# Patient Record
Sex: Female | Born: 2000 | Race: White | Hispanic: No | Marital: Single | Smoking: Never smoker
Health system: Southern US, Community
[De-identification: ages and names within clinical notes are randomized; demographics above are authoritative.]

## PROBLEM LIST (undated history)

## (undated) DIAGNOSIS — R011 Cardiac murmur, unspecified: Secondary | ICD-10-CM

## (undated) DIAGNOSIS — IMO0002 Reserved for concepts with insufficient information to code with codable children: Secondary | ICD-10-CM

## (undated) DIAGNOSIS — J45909 Unspecified asthma, uncomplicated: Secondary | ICD-10-CM

## (undated) DIAGNOSIS — M3500 Sicca syndrome, unspecified: Secondary | ICD-10-CM

## (undated) DIAGNOSIS — T7840XA Allergy, unspecified, initial encounter: Secondary | ICD-10-CM

## (undated) HISTORY — DX: Cardiac murmur, unspecified: R01.1

## (undated) HISTORY — PX: WISDOM TOOTH EXTRACTION: SHX21

## (undated) HISTORY — DX: Sjogren syndrome, unspecified: M35.00

## (undated) HISTORY — DX: Allergy, unspecified, initial encounter: T78.40XA

---
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
## 2020-01-04 ENCOUNTER — Ambulatory Visit: Payer: 59 | Attending: Internal Medicine

## 2020-01-04 DIAGNOSIS — Z23 Encounter for immunization: Secondary | ICD-10-CM

## 2020-01-04 NOTE — Progress Notes (Signed)
   Covid-19 Vaccination Clinic  Name:  Kendra Morton    MRN: [id number] DOB: 2001-07-12  01/04/2020  Kendra Morton was observed post Covid-19 immunization for 15 minutes without incident. She was provided with Vaccine Information Sheet and instruction to access the V-Safe system.   Kendra Morton was instructed to call 911 with any severe reactions post vaccine: Marland Kitchen Difficulty breathing  . Swelling of face and throat  . A fast heartbeat  . A bad rash all over body  . Dizziness and weakness   Immunizations Administered    Name Date Dose VIS Date Route   Pfizer COVID-19 Vaccine 01/04/2020 10:50 AM 0.3 mL 10/26/2018 Intramuscular   Manufacturer: [employer], [employer]   Lot: [id number]   NDC: [address]-9

## 2020-02-20 DIAGNOSIS — M79671 Pain in right foot: Secondary | ICD-10-CM | POA: Diagnosis not present

## 2020-02-20 DIAGNOSIS — M21622 Bunionette of left foot: Secondary | ICD-10-CM | POA: Diagnosis not present

## 2020-02-20 DIAGNOSIS — M21621 Bunionette of right foot: Secondary | ICD-10-CM | POA: Diagnosis not present

## 2020-02-20 DIAGNOSIS — M79672 Pain in left foot: Secondary | ICD-10-CM | POA: Diagnosis not present

## 2020-03-20 ENCOUNTER — Other Ambulatory Visit: Payer: Self-pay | Admitting: Podiatry

## 2020-03-20 DIAGNOSIS — M21622 Bunionette of left foot: Secondary | ICD-10-CM | POA: Diagnosis not present

## 2020-03-21 ENCOUNTER — Encounter
Admission: RE | Admit: 2020-03-21 | Discharge: 2020-03-21 | Disposition: A | Discharge status: Home / Self Care | Payer: 59 | Source: Ambulatory Visit | Attending: Podiatry | Admitting: Podiatry

## 2020-03-21 ENCOUNTER — Other Ambulatory Visit: Payer: Self-pay

## 2020-03-21 NOTE — Patient Instructions (Signed)
Your procedure is scheduled on: Friday March 30, 2020. Report to Day Surgery inside [facility] 2nd floor. To find out your arrival time please call [phone] between 1PM - 3PM on Thursday March 29, 2020.  Remember: Instructions that are not followed completely may result in serious medical risk,  up to and including death, or upon the discretion of your surgeon and anesthesiologist your  surgery may need to be rescheduled.     _X__ 1. Do not eat food after midnight the night before your procedure.                 No gum chewing or hard candies. You may drink clear liquids up to 2 hours                 before you are scheduled to arrive for your surgery- DO not drink clear                 liquids within 2 hours of the start of your surgery.                 Clear Liquids include:  water, apple juice without pulp, clear Gatorade, G2 or                  Gatorade Zero (avoid Red/Purple/Blue), Black Coffee or Tea (Do not add                 anything to coffee or tea).  __X__2. Complete the carbohydrate drink provided to you, 2 hours before arrival.  __X__3.  On the morning of surgery brush your teeth with toothpaste and water, you                may rinse your mouth with mouthwash if you wish.  Do not swallow any toothpaste of mouthwash.     _X__ 4.  No Alcohol for 24 hours before or after surgery.   _X__ 5.  Do Not Smoke or use e-cigarettes For 24 Hours Prior to Your Surgery.                 Do not use any chewable tobacco products for at least 6 hours prior to                 Surgery.  _X__  6.  Do not use any recreational drugs (marijuana, cocaine, heroin, ecstacy, MDMA or other)                For at least one week prior to your surgery.  Combination of these drugs with anesthesia                May have life threatening results.  __X__  7.  Notify your doctor if there is any change in your medical condition      (cold, fever, infections).     Do not wear  jewelry, make-up, hairpins, clips or nail polish. Do not wear lotions, powders, or perfumes. You may wear deodorant. Do not shave 48 hours prior to surgery. Men may shave face and neck. Do not bring valuables to the hospital.    [facility] is not responsible for any belongings or valuables.  Contacts, dentures or bridgework may not be worn into surgery. Leave your suitcase in the car. After surgery it may be brought to your room. For patients admitted to the hospital, discharge time is determined by your treatment team.   Patients discharged the day of surgery will  not be allowed to drive home.   Make arrangements for someone to be with you for the first 24 hours of your Same Day Discharge.   ____ Take these medicines the morning of surgery with A SIP OF WATER:    1. None  __X__ Use CHG Soap as directed  __X__ Use inhalers on the day of surgery  albuterol (VENTOLIN HFA) 108 (90 Base) MCG/ACT inhaler  fluticasone (FLOVENT HFA) 110 MCG/ACT inhaler  __X__ Stop Anti-inflammatories such as Ibuprofen, Aleve, Advil, naproxen, aspirin and or BC powders.    ____ Stop supplements until after surgery.    __X__ Do not start any herbal supplements before your surgery.

## 2020-03-27 ENCOUNTER — Other Ambulatory Visit
Admission: RE | Admit: 2020-03-27 | Discharge: 2020-03-27 | Disposition: A | Discharge status: Home / Self Care | Payer: 59 | Source: Ambulatory Visit | Attending: Podiatry | Admitting: Podiatry

## 2020-03-27 ENCOUNTER — Other Ambulatory Visit: Payer: Self-pay

## 2020-03-27 DIAGNOSIS — Z20822 Contact with and (suspected) exposure to covid-19: Secondary | ICD-10-CM | POA: Insufficient documentation

## 2020-03-27 DIAGNOSIS — Z01812 Encounter for preprocedural laboratory examination: Secondary | ICD-10-CM | POA: Diagnosis not present

## 2020-03-27 LAB — SARS CORONAVIRUS 2 (TAT 6-24 HRS): SARS Coronavirus 2: NEGATIVE

## 2020-03-28 ENCOUNTER — Other Ambulatory Visit: Payer: 59

## 2020-03-30 ENCOUNTER — Ambulatory Visit: Payer: 59

## 2020-03-30 ENCOUNTER — Encounter
Admission: RE | Disposition: A | Discharge status: Home / Self Care | Payer: Self-pay | Source: Ambulatory Visit | Attending: Podiatry

## 2020-03-30 ENCOUNTER — Ambulatory Visit: Payer: 59 | Admitting: Anesthesiology

## 2020-03-30 ENCOUNTER — Ambulatory Visit
Admission: RE | Admit: 2020-03-30 | Discharge: 2020-03-30 | Disposition: A | Discharge status: Home / Self Care | Payer: 59 | Source: Ambulatory Visit | Attending: Podiatry | Admitting: Podiatry

## 2020-03-30 ENCOUNTER — Encounter: Payer: Self-pay | Admitting: Podiatry

## 2020-03-30 DIAGNOSIS — J45909 Unspecified asthma, uncomplicated: Secondary | ICD-10-CM | POA: Insufficient documentation

## 2020-03-30 DIAGNOSIS — M21612 Bunion of left foot: Secondary | ICD-10-CM | POA: Insufficient documentation

## 2020-03-30 DIAGNOSIS — M216X2 Other acquired deformities of left foot: Secondary | ICD-10-CM | POA: Diagnosis not present

## 2020-03-30 DIAGNOSIS — Z79899 Other long term (current) drug therapy: Secondary | ICD-10-CM | POA: Insufficient documentation

## 2020-03-30 DIAGNOSIS — M21622 Bunionette of left foot: Secondary | ICD-10-CM | POA: Diagnosis not present

## 2020-03-30 HISTORY — PX: WEIL OSTEOTOMY: SHX5044

## 2020-03-30 LAB — POCT PREGNANCY, URINE: Preg Test, Ur: NEGATIVE

## 2020-03-30 SURGERY — OSTEOTOMY, WEIL
Anesthesia: General | Site: Foot | Laterality: Left

## 2020-03-30 MED ORDER — BUPIVACAINE LIPOSOME 1.3 % IJ SUSP
INTRAMUSCULAR | Status: AC
  Filled 2020-03-30: qty 20

## 2020-03-30 MED ORDER — PROPOFOL 500 MG/50ML IV EMUL
INTRAVENOUS | Status: AC
  Filled 2020-03-30: qty 50

## 2020-03-30 MED ORDER — POVIDONE-IODINE 7.5 % EX SOLN
Freq: Once | CUTANEOUS | Status: DC
  Filled 2020-03-30: qty 118

## 2020-03-30 MED ORDER — FENTANYL CITRATE (PF) 100 MCG/2ML IJ SOLN: 25.0000 ug | INTRAMUSCULAR | Status: DC | PRN

## 2020-03-30 MED ORDER — ONDANSETRON HCL 4 MG/2ML IJ SOLN
INTRAMUSCULAR | Status: DC | PRN
  Administered 2020-03-30: 4 mg via INTRAVENOUS

## 2020-03-30 MED ORDER — MIDAZOLAM HCL 2 MG/2ML IJ SOLN
INTRAMUSCULAR | Status: DC | PRN
  Administered 2020-03-30: 2 mg via INTRAVENOUS

## 2020-03-30 MED ORDER — LIDOCAINE HCL (PF) 2 % IJ SOLN
INTRAMUSCULAR | Status: AC
  Filled 2020-03-30: qty 5

## 2020-03-30 MED ORDER — MIDAZOLAM HCL 2 MG/2ML IJ SOLN
INTRAMUSCULAR | Status: AC
  Filled 2020-03-30: qty 2

## 2020-03-30 MED ORDER — BUPIVACAINE-EPINEPHRINE (PF) 0.5% -1:200000 IJ SOLN
INTRAMUSCULAR | Status: AC
  Filled 2020-03-30: qty 30

## 2020-03-30 MED ORDER — ONDANSETRON HCL 4 MG/2ML IJ SOLN: 4.0000 mg | Freq: Four times a day (QID) | INTRAMUSCULAR | Status: DC | PRN

## 2020-03-30 MED ORDER — PROPOFOL 500 MG/50ML IV EMUL
INTRAVENOUS | Status: DC | PRN
  Administered 2020-03-30: 150 ug/kg/min via INTRAVENOUS

## 2020-03-30 MED ORDER — FAMOTIDINE 20 MG PO TABS: 20.0000 mg | ORAL_TABLET | Freq: Once | ORAL | Status: DC

## 2020-03-30 MED ORDER — ORAL CARE MOUTH RINSE: 15.0000 mL | Freq: Once | OROMUCOSAL | Status: AC

## 2020-03-30 MED ORDER — FENTANYL CITRATE (PF) 100 MCG/2ML IJ SOLN
INTRAMUSCULAR | Status: AC
  Filled 2020-03-30: qty 2

## 2020-03-30 MED ORDER — HYDROCODONE-ACETAMINOPHEN 5-325 MG PO TABS: 1.0000 | ORAL_TABLET | Freq: Four times a day (QID) | ORAL | 0 refills | Status: DC | PRN

## 2020-03-30 MED ORDER — CHLORHEXIDINE GLUCONATE 0.12 % MT SOLN: 15.0000 mL | Freq: Once | OROMUCOSAL | Status: AC

## 2020-03-30 MED ORDER — LACTATED RINGERS IV SOLN: INTRAVENOUS | Status: DC

## 2020-03-30 MED ORDER — ONDANSETRON HCL 4 MG/2ML IJ SOLN: 4.0000 mg | Freq: Once | INTRAMUSCULAR | Status: DC | PRN

## 2020-03-30 MED ORDER — BUPIVACAINE HCL (PF) 0.5 % IJ SOLN
INTRAMUSCULAR | Status: AC
  Filled 2020-03-30: qty 30

## 2020-03-30 MED ORDER — LIDOCAINE HCL (CARDIAC) PF 100 MG/5ML IV SOSY
PREFILLED_SYRINGE | INTRAVENOUS | Status: DC | PRN
  Administered 2020-03-30: 100 mg via INTRAVENOUS

## 2020-03-30 MED ORDER — CLINDAMYCIN PHOSPHATE 900 MG/50ML IV SOLN
INTRAVENOUS | Status: AC
  Filled 2020-03-30: qty 50

## 2020-03-30 MED ORDER — FENTANYL CITRATE (PF) 100 MCG/2ML IJ SOLN
INTRAMUSCULAR | Status: DC | PRN
  Administered 2020-03-30: 50 ug via INTRAVENOUS

## 2020-03-30 MED ORDER — BUPIVACAINE HCL (PF) 0.25 % IJ SOLN
INTRAMUSCULAR | Status: DC | PRN
  Administered 2020-03-30: 5 mL

## 2020-03-30 MED ORDER — CHLORHEXIDINE GLUCONATE 0.12 % MT SOLN
OROMUCOSAL | Status: AC
  Administered 2020-03-30: 15 mL via OROMUCOSAL
  Filled 2020-03-30: qty 15

## 2020-03-30 MED ORDER — CLINDAMYCIN PHOSPHATE 900 MG/50ML IV SOLN
900.0000 mg | INTRAVENOUS | Status: AC
  Administered 2020-03-30: 900 mg via INTRAVENOUS

## 2020-03-30 MED ORDER — ONDANSETRON HCL 4 MG PO TABS: 4.0000 mg | ORAL_TABLET | Freq: Four times a day (QID) | ORAL | Status: DC | PRN

## 2020-03-30 MED ORDER — PROPOFOL 10 MG/ML IV BOLUS
INTRAVENOUS | Status: DC | PRN
  Administered 2020-03-30: 200 mg via INTRAVENOUS

## 2020-03-30 MED ORDER — BUPIVACAINE LIPOSOME 1.3 % IJ SUSP
INTRAMUSCULAR | Status: DC | PRN
  Administered 2020-03-30 (×2): 5 mL

## 2020-03-30 MED ORDER — PROPOFOL 500 MG/50ML IV EMUL
INTRAVENOUS | Status: AC
  Filled 2020-03-30: qty 100

## 2020-03-30 SURGICAL SUPPLY — 44 items
BLADE OSC/SAGITTAL 5.5X25 (BLADE) ×2 IMPLANT
BLADE OSC/SAGITTAL MD 5.5X18 (BLADE) ×2 IMPLANT
BLADE SURG 15 STRL LF DISP TIS (BLADE) ×2 IMPLANT
BLADE SURG 15 STRL SS (BLADE) ×4
BNDG CMPR 75X41 PLY HI ABS (GAUZE/BANDAGES/DRESSINGS) ×1
BNDG CMPR STD VLCR NS LF 5.8X4 (GAUZE/BANDAGES/DRESSINGS) ×2
BNDG CONFORM 3 STRL LF (GAUZE/BANDAGES/DRESSINGS) ×2 IMPLANT
BNDG ELASTIC 4X5.8 VLCR NS LF (GAUZE/BANDAGES/DRESSINGS) ×4 IMPLANT
BNDG ELASTIC 4X5.8 VLCR STR LF (GAUZE/BANDAGES/DRESSINGS) ×2 IMPLANT
BNDG ESMARK 4X12 TAN STRL LF (GAUZE/BANDAGES/DRESSINGS) ×2 IMPLANT
BNDG GAUZE 4.5X4.1 6PLY STRL (MISCELLANEOUS) ×2 IMPLANT
BNDG STRETCH 4X75 STRL LF (GAUZE/BANDAGES/DRESSINGS) ×2 IMPLANT
CANISTER SUCT 1200ML W/VALVE (MISCELLANEOUS) ×2 IMPLANT
COVER WAND RF STERILE (DRAPES) ×2 IMPLANT
CUFF TOURN SGL QUICK 12 (TOURNIQUET CUFF) IMPLANT
CUFF TOURN SGL QUICK 18X4 (TOURNIQUET CUFF) IMPLANT
DRAPE FLUOR MINI C-ARM 54X84 (DRAPES) ×2 IMPLANT
DURAPREP 26ML APPLICATOR (WOUND CARE) ×2 IMPLANT
ELECT REM PT RETURN 9FT ADLT (ELECTROSURGICAL) ×2
ELECTRODE REM PT RTRN 9FT ADLT (ELECTROSURGICAL) ×1 IMPLANT
GAUZE SPONGE 4X4 12PLY STRL (GAUZE/BANDAGES/DRESSINGS) ×2 IMPLANT
GAUZE XEROFORM 1X8 LF (GAUZE/BANDAGES/DRESSINGS) ×2 IMPLANT
GLOVE BIO SURGEON STRL SZ7.5 (GLOVE) ×2 IMPLANT
GLOVE INDICATOR 8.0 STRL GRN (GLOVE) ×2 IMPLANT
GOWN STRL REUS W/ TWL XL LVL3 (GOWN DISPOSABLE) ×2 IMPLANT
GOWN STRL REUS W/TWL XL LVL3 (GOWN DISPOSABLE) ×4
KIT TURNOVER KIT A (KITS) ×2 IMPLANT
LABEL OR SOLS (LABEL) ×2 IMPLANT
NDL SAFETY ECLIPSE 18X1.5 (NEEDLE) ×1 IMPLANT
NEEDLE HYPO 18GX1.5 SHARP (NEEDLE) ×2
NEEDLE HYPO 25X1 1.5 SAFETY (NEEDLE) ×6 IMPLANT
NS IRRIG 500ML POUR BTL (IV SOLUTION) ×2 IMPLANT
PACK EXTREMITY (MISCELLANEOUS) ×2 IMPLANT
PAD ABD DERMACEA PRESS 5X9 (GAUZE/BANDAGES/DRESSINGS) ×2 IMPLANT
PENCIL ELECTRO HAND CTR (MISCELLANEOUS) ×2 IMPLANT
STOCKINETTE IMPERVIOUS 9X36 MD (GAUZE/BANDAGES/DRESSINGS) ×2 IMPLANT
STRIP CLOSURE SKIN 1/4X4 (GAUZE/BANDAGES/DRESSINGS) ×2 IMPLANT
SUT MNCRL AB 4-0 PS2 18 (SUTURE) ×2 IMPLANT
SUT VIC AB 4-0 FS2 27 (SUTURE) ×2 IMPLANT
SUT VICRYL AB 3-0 FS1 BRD 27IN (SUTURE) ×2 IMPLANT
SYR 10ML LL (SYRINGE) ×4 IMPLANT
WIRE Z .045 C-WIRE SPADE TIP (WIRE) ×2 IMPLANT
WIRE Z .062 C-WIRE SPADE TIP (WIRE) ×4 IMPLANT
kirschiner k wire ×2 IMPLANT

## 2020-03-30 NOTE — H&P (Signed)
HISTORY AND PHYSICAL INTERVAL NOTE:  03/30/2020  7:24 AM  Kendra Morton  has presented today for surgery, with the diagnosis of TAILOR BUNION.  The various methods of treatment have been discussed with the patient.  No guarantees were given.  After consideration of risks, benefits and other options for treatment, the patient has consented to surgery.  I have reviewed the patients' chart and labs.     A history and physical examination was performed in my office.  The patient was reexamined.  There have been no changes to this history and physical examination.  Kendra Morton A

## 2020-03-30 NOTE — Discharge Instructions (Signed)
       AMBULATORY SURGERY  DISCHARGE INSTRUCTIONS   1) The drugs that you were given will stay in your system until tomorrow so for the next 24 hours you should not:  A) Drive an automobile B) Make any legal decisions C) Drink any alcoholic beverage   2) You may resume regular meals tomorrow.  Today it is better to start with liquids and gradually work up to solid foods.  You may eat anything you prefer, but it is better to start with liquids, then soup and crackers, and gradually work up to solid foods.   3) Please notify your doctor immediately if you have any unusual bleeding, trouble breathing, redness and pain at the surgery site, drainage, fever, or pain not relieved by medication.    4) Additional Instructions:        Please contact your physician with any problems or Same Day Surgery at [phone], Monday through Friday 6 am to 4 pm, or [place] [facility] number at [phone].  [facility]  POST OPERATIVE INSTRUCTIONS FOR DR. TROXLER, DR. FOWLER, AND DR. [facility]PART[facility]   1. Take your medication as prescribed.  Pain medication should be taken only as needed.  2. Keep the dressing clean, dry and intact.  3. Keep your foot elevated above the heart level for the first 48 hours.  4. Walking to the bathroom and brief periods of walking are acceptable, unless we have instructed you to be non-weight bearing.  5. Always wear your post-op shoe when walking.  Always use your crutches if you are to be non-weight bearing.  6. Do not take a shower. Baths are permissible as long as the foot is kept out of the water.   7. Every hour you are awake:  - Bend your knee 15 times. - Flex foot 15 times - Massage calf 15 times  8. Call [facility] ([phone]) if any of the following problems occur: - You develop a temperature or fever. - The bandage becomes saturated with  blood. - Medication does not stop your pain. - Injury of the foot occurs. - Any symptoms of infection including redness, odor, or red streaks running from wound.  

## 2020-03-30 NOTE — Op Note (Signed)
Operative note   Surgeon:Johnmatthew Solorio [employer] logistics/support/administrative officer: None    Preop diagnosis: Tailor's bunion left foot    Postop diagnosis: Same    Procedure: Distal fifth metatarsal Z osteotomy left foot    EBL: Minimal    Anesthesia:local and general. Local consisted of a total of 5 cc of 0.25% bupivacaine and 10 cc of Exparel long-acting anesthetic    Hemostasis: Ankle tourniquet inflated to 200 mmHg for 47 minutes    Specimen: None    Complications: None    Operative indications:Kendra Morton is an 19 y.o. that presents today for surgical intervention.  The risks/benefits/alternatives/complications have been discussed and consent has been given.    Procedure:  Patient was brought into the OR and placed on the operating table in thesupine position. After anesthesia was obtained theleft lower extremity was prepped and draped in usual sterile fashion.  Was directed to the dorsomedial left fifth MTPJ. Sharp and blunt dissection carried down to the capsule. A longitudinal capsulotomy was performed. This exposed the laterally deviated distal fifth metatarsal. Next a distal Z osteotomy was created. The capital fragment was translocated and rotated medially. Good realignment of the fifth MTPJ was noted on fluoroscopy intraoperatively. This was then stabilized with a 0.045 threaded K wire. Good alignment and stability was noted at this time. The ensuing overhanging ledge was then transected. The dorsal lateral aspect of the fifth metatarsal was recontoured. The wound was flushed with copious amounts of irrigation. A small capsulorrhaphy was performed laterally. Closure was performed with a 3-0 Vicryl for the capsular layer, 4-0 Vicryl for the subcutaneous tissue and a 4-0 Monocryl for the skin. A bulky sterile dressing was applied.    Patient tolerated the procedure and anesthesia well.  Was transported from the OR to the PACU with all vital signs stable and vascular status intact. To be discharged  per routine protocol.  Will follow up in approximately 1 week in the outpatient clinic. A prescription for Norco was sent to her pharmacy.

## 2020-03-30 NOTE — Anesthesia Procedure Notes (Signed)
Procedure Name: LMA Insertion Date/Time: 03/30/2020 7:41 AM Performed by: Pearla Dubonnet, CRNA Pre-anesthesia Checklist: Patient identified, Emergency Drugs available, Suction available, Patient being monitored and Timeout performed Patient Re-evaluated:Patient Re-evaluated prior to induction Oxygen Delivery Method: Circle system utilized Preoxygenation: Pre-oxygenation with 100% oxygen Induction Type: IV induction Ventilation: Mask ventilation without difficulty LMA: LMA inserted LMA Size: 3.5 Tube secured with: Tape Dental Injury: Teeth and Oropharynx as per pre-operative assessment

## 2020-03-30 NOTE — Transfer of Care (Signed)
Immediate Anesthesia Transfer of Care Note  Patient: Kendra Morton  Procedure(s) Performed: Gerhard Munch OSTEOTOMY/TAILOR'S BUNION LEFT (Left Foot)  Patient Location: PACU  Anesthesia Type:General  Level of Consciousness: awake  Airway & Oxygen Therapy: Patient Spontanous Breathing  Post-op Assessment: Report given to RN  Post vital signs: stable  Last Vitals:  Vitals Value Taken Time  BP 105/54 03/30/20 0901  Temp 36.4 C 03/30/20 0901  Pulse 76 03/30/20 0904  Resp 14 03/30/20 0904  SpO2 100 % 03/30/20 0904  Vitals shown include unvalidated device data.  Last Pain:  Vitals:   03/30/20 0901  PainSc: Asleep         Complications: No complications documented.

## 2020-03-30 NOTE — Anesthesia Preprocedure Evaluation (Addendum)
Anesthesia Evaluation  Patient identified by MRN, date of birth, ID band Patient awake    Reviewed: Allergy & Precautions, NPO status , Patient's Chart, lab work & pertinent test results  Airway Mallampati: II  TM Distance: >3 FB     Dental   Pulmonary asthma ,    Pulmonary exam normal        Cardiovascular Normal cardiovascular exam+ Valvular Problems/Murmurs      Neuro/Psych negative neurological ROS  negative psych ROS   GI/Hepatic negative GI ROS, Neg liver ROS,   Endo/Other  negative endocrine ROS  Renal/GU negative Renal ROS  negative genitourinary   Musculoskeletal   Abdominal Normal abdominal exam  (+)   Peds negative pediatric ROS (+)  Hematology negative hematology ROS (+)   Anesthesia Other Findings Past Medical History: No date: Allergy No date: Asthma No date: Heart murmur     Comment:  Only when she was younger No date: Onychia and paronychia     Comment:  right toe  Reproductive/Obstetrics                             Anesthesia Physical Anesthesia Plan  ASA: II  Anesthesia Plan: General   Post-op Pain Management:    Induction:   PONV Risk Score and Plan:   Airway Management Planned: LMA  Additional Equipment:   Intra-op Plan:   Post-operative Plan: Extubation in OR  Informed Consent: I have reviewed the patients History and Physical, chart, labs and discussed the procedure including the risks, benefits and alternatives for the proposed anesthesia with the patient or authorized representative who has indicated his/her understanding and acceptance.     Dental advisory given  Plan Discussed with: CRNA and Surgeon  Anesthesia Plan Comments: (Noted family Hx of MH.  Discussed with the patient that we will do GA with LMA using nontriggering agents with propofol infusion .  Machine was flushed with waporizers off and appropriate filters in line.  Patient  understands and agrees and surgeon wants GA.)       Anesthesia Quick Evaluation

## 2020-03-31 ENCOUNTER — Encounter: Payer: Self-pay | Admitting: Podiatry

## 2020-04-02 NOTE — Anesthesia Postprocedure Evaluation (Signed)
Anesthesia Post Note  Patient: Kendra Morton  Procedure(s) Performed: WEIL OSTEOTOMY/TAILOR'S BUNION LEFT (Left Foot)  Patient location during evaluation: PACU Anesthesia Type: General Level of consciousness: awake and alert and oriented Pain management: pain level controlled Vital Signs Assessment: post-procedure vital signs reviewed and stable Respiratory status: spontaneous breathing Cardiovascular status: blood pressure returned to baseline Anesthetic complications: no   No complications documented.   Last Vitals:  Vitals:   03/30/20 1015 03/30/20 1029  BP: (!) 105/64 (!) 104/60  Pulse: 55   Resp: 18   Temp:    SpO2: 100% 100%    Last Pain:  Vitals:   03/30/20 1015  PainSc: 0-No pain                 Alesa Echevarria

## 2020-04-04 DIAGNOSIS — M21622 Bunionette of left foot: Secondary | ICD-10-CM | POA: Diagnosis not present

## 2020-04-12 DIAGNOSIS — M21622 Bunionette of left foot: Secondary | ICD-10-CM | POA: Diagnosis not present

## 2020-04-12 DIAGNOSIS — M79672 Pain in left foot: Secondary | ICD-10-CM | POA: Diagnosis not present

## 2020-04-28 ENCOUNTER — Ambulatory Visit
Admission: EM | Admit: 2020-04-28 | Discharge: 2020-04-28 | Disposition: A | Discharge status: Home / Self Care | Payer: 59 | Attending: Internal Medicine | Admitting: Internal Medicine

## 2020-04-28 ENCOUNTER — Other Ambulatory Visit: Payer: Self-pay

## 2020-04-28 ENCOUNTER — Encounter: Payer: Self-pay | Admitting: Emergency Medicine

## 2020-04-28 DIAGNOSIS — N3001 Acute cystitis with hematuria: Secondary | ICD-10-CM | POA: Insufficient documentation

## 2020-04-28 LAB — URINALYSIS, COMPLETE (UACMP) WITH MICROSCOPIC
Bilirubin Urine: NEGATIVE
Glucose, UA: NEGATIVE mg/dL
Ketones, ur: NEGATIVE mg/dL
Nitrite: NEGATIVE
Protein, ur: 30 mg/dL — AB
RBC / HPF: >50 RBC/hpf (ref 0–5)
Specific Gravity, Urine: 1.015 (ref 1.005–1.030)
WBC, UA: >50 WBC/hpf (ref 0–5)
pH: 6.5 (ref 5.0–8.0)

## 2020-04-28 MED ORDER — NITROFURANTOIN MONOHYD MACRO 100 MG PO CAPS: 100.0000 mg | ORAL_CAPSULE | Freq: Two times a day (BID) | ORAL | 0 refills | Status: DC

## 2020-04-28 NOTE — ED Provider Notes (Signed)
[facility]    CSN: [id number] Arrival date & time: 04/28/20  0846      History   Chief Complaint Chief Complaint  Patient presents with  . Dysuria  . Back Pain    HPI Kendra Morton is a 18 y.o. female comes to the urgent care with complaints of urinary frequency and urgency about 2 to 3 days ago.  Patient says symptoms started insidiously and is getting progressively worse.  Yesterday she started experiencing some burning sensation when she urinates and also some back pain and has a visit to the urgent care to be evaluated.  She denies any nausea, vomiting, fever or chills.  Back pain is midline.  She denies any flank pain.  No vaginal discharge.   HPI  Past Medical History:  Diagnosis Date  . Allergy   . Asthma   . Heart murmur    Only when she was younger  . Onychia and paronychia    right toe    Patient Active Problem List   Diagnosis Date Noted  . White coat syndrome with high blood pressure but without hypertension 08/21/2017  . Dysmenorrhea in adolescent 08/21/2017  . Mild intermittent asthma without complication 06/18/2017  . History of heart murmur in childhood 06/18/2017  . Environmental allergies 06/18/2017    Past Surgical History:  Procedure Laterality Date  . WEIL OSTEOTOMY Left 03/30/2020   Procedure: WEIL OSTEOTOMY/TAILOR'S BUNION LEFT;  Surgeon: [name], DPM;  Location: [facility] ORS;  Service: Podiatry;  Laterality: Left;  . WISDOM TOOTH EXTRACTION      OB History   No obstetric history on file.      Home Medications    Prior to Admission medications   Medication Sig Start Date End Date Taking? Authorizing Provider  albuterol (VENTOLIN HFA) 108 (90 Base) MCG/ACT inhaler Inhale 1-2 puffs into the lungs every 6 (six) hours as needed for wheezing or shortness of breath. 07/25/19  Yes Joycelyn Man M, PA-C  fluticasone (FLOVENT HFA) 110 MCG/ACT inhaler Inhale 2 puffs into the lungs daily. 07/25/19  Yes Margaretann Loveless, PA-C  levonorgestrel (MIRENA) 20 MCG/24HR IUD 1 each by Intrauterine route once.   Yes [provider]  nitrofurantoin, macrocrystal-monohydrate, (MACROBID) 100 MG capsule Take 1 capsule (100 mg total) by mouth 2 (two) times daily. 04/28/20   LampteyBritta Mccreedy, MD    Family History Family History  Problem Relation Age of Onset  . Autism Sister   . Seizures Sister   . ADD / ADHD Brother   . Diabetes Maternal Grandfather   . Hypertension Maternal Grandfather   . Heart disease Paternal Grandfather        CHF,MI    Social History Social History   Tobacco Use  . Smoking status: Never Smoker  . Smokeless tobacco: Never Used  Vaping Use  . Vaping Use: Never used  Substance Use Topics  . Alcohol use: No  . Drug use: No     Allergies   Cephalexin and Penicillins   Review of Systems Review of Systems  Constitutional: Negative.   Gastrointestinal: Negative for abdominal pain, diarrhea, nausea and vomiting.  Genitourinary: Positive for dysuria, frequency and urgency. Negative for genital sores and vaginal discharge.  Psychiatric/Behavioral: Negative for confusion and decreased concentration.     Physical Exam Triage Vital Signs ED Triage Vitals  Enc Vitals Group     BP 04/28/20 0859 111/60     Pulse Rate 04/28/20 0859 80     Resp  04/28/20 0859 14     Temp 04/28/20 0859 98.2 F (36.8 C)     Temp Source 04/28/20 0859 Oral     SpO2 04/28/20 0859 100 %     Weight 04/28/20 0856 145 lb (65.8 kg)     Height 04/28/20 0856 5\' 5"  (1.651 m)     Head Circumference --      Peak Flow --      Pain Score 04/28/20 0856 3     Pain Loc --      Pain Edu? --      Excl. in GC? --    No data found.  Updated Vital Signs BP 111/60 (BP Location: Left Arm)   Pulse 80   Temp 98.2 F (36.8 C) (Oral)   Resp 14   Ht 5\' 5"  (1.651 m)   Wt 65.8 kg   SpO2 100%   BMI 24.13 kg/m   Visual Acuity Right Eye Distance:   Left Eye Distance:   Bilateral Distance:     Right Eye Near:   Left Eye Near:    Bilateral Near:     Physical Exam Vitals and nursing note reviewed.  Constitutional:      Appearance: Normal appearance.  Pulmonary:     Effort: Pulmonary effort is normal.     Breath sounds: Normal breath sounds.  Abdominal:     General: Bowel sounds are normal.     Tenderness: There is no right CVA tenderness, left CVA tenderness or guarding.  Musculoskeletal:        General: Normal range of motion.  Neurological:     Mental Status: She is alert.      UC Treatments / Results  Labs (all labs ordered are listed, but only abnormal results are displayed) Labs Reviewed  URINALYSIS, COMPLETE (UACMP) WITH MICROSCOPIC - Abnormal; Notable for the following components:      Result Value   APPearance HAZY (*)    Hgb urine dipstick MODERATE (*)    Protein, ur 30 (*)    Leukocytes,Ua SMALL (*)    Bacteria, UA RARE (*)    All other components within normal limits  URINE CULTURE    EKG   Radiology No results found.  Procedures Procedures (including critical care time)  Medications Ordered in UC Medications - No data to display  Initial Impression / Assessment and Plan / UC Course  I have reviewed the triage vital signs and the nursing notes.  Pertinent labs & imaging results that were available during my care of the patient were reviewed by me and considered in my medical decision making (see chart for details).     1.  Acute cystitis with hematuria: Urinalysis is significant for calcium oxalate crystals, RBC greater than 50 per high-power field and WBC greater than 50 per high-power field. Urine cultures have been sent Given the presence of calcium oxalate crystals if the cultures are negative, I suspect that the patient may have kidney stones. She is advised to increase oral fluid intake Patient to return to urgent care if symptoms are worse. Final Clinical Impressions(s) / UC Diagnoses   Final diagnoses:  Acute cystitis  with hematuria   Discharge Instructions   None    ED Prescriptions    Medication Sig Dispense Auth. Provider   nitrofurantoin, macrocrystal-monohydrate, (MACROBID) 100 MG capsule Take 1 capsule (100 mg total) by mouth 2 (two) times daily. 10 capsule Markeya Mincy, 04/30/20, MD     PDMP not reviewed this encounter.  Merrilee Jansky, MD 04/28/20 [phone]

## 2020-04-28 NOTE — ED Triage Notes (Signed)
Patient c/o burning when urinating and urinary frequency that stated 2-3 days ago.  Patient reports lower back pain that started yesterday.

## 2020-05-01 LAB — URINE CULTURE: Culture: >=100000 — AB

## 2020-05-15 DIAGNOSIS — M21622 Bunionette of left foot: Secondary | ICD-10-CM | POA: Diagnosis not present

## 2020-05-25 ENCOUNTER — Ambulatory Visit (INDEPENDENT_AMBULATORY_CARE_PROVIDER_SITE_OTHER): Payer: 59 | Admitting: Physician Assistant

## 2020-05-25 ENCOUNTER — Encounter: Payer: Self-pay | Admitting: Physician Assistant

## 2020-05-25 ENCOUNTER — Other Ambulatory Visit: Payer: Self-pay

## 2020-05-25 VITALS — BP 127/75 | HR 87 | Temp 98.5°F | Wt 150.8 lb

## 2020-05-25 DIAGNOSIS — F411 Generalized anxiety disorder: Secondary | ICD-10-CM | POA: Diagnosis not present

## 2020-05-25 DIAGNOSIS — Z23 Encounter for immunization: Secondary | ICD-10-CM | POA: Diagnosis not present

## 2020-05-25 DIAGNOSIS — R41840 Attention and concentration deficit: Secondary | ICD-10-CM

## 2020-05-25 MED ORDER — SERTRALINE HCL 50 MG PO TABS: 50.0000 mg | ORAL_TABLET | Freq: Every day | ORAL | 3 refills | Status: DC

## 2020-05-25 NOTE — Patient Instructions (Signed)
Sertraline tablets What is this medicine? SERTRALINE (SER tra leen) is used to treat depression. It may also be used to treat obsessive compulsive disorder, panic disorder, post-trauma stress, premenstrual dysphoric disorder (PMDD) or social anxiety. This medicine may be used for other purposes; ask your health care provider or pharmacist if you have questions. COMMON BRAND NAME(S): Zoloft What should I tell my health care provider before I take this medicine? They need to know if you have any of these conditions:  bleeding disorders  bipolar disorder or a family history of bipolar disorder  glaucoma  heart disease  high blood pressure  history of irregular heartbeat  history of low levels of calcium, magnesium, or potassium in the blood  if you often drink alcohol  liver disease  receiving electroconvulsive therapy  seizures  suicidal thoughts, plans, or attempt; a previous suicide attempt by you or a family member  take medicines that treat or prevent blood clots  thyroid disease  an unusual or allergic reaction to sertraline, other medicines, foods, dyes, or preservatives  pregnant or trying to get pregnant  breast-feeding How should I use this medicine? Take this medicine by mouth with a glass of water. Follow the directions on the prescription label. You can take it with or without food. Take your medicine at regular intervals. Do not take your medicine more often than directed. Do not stop taking this medicine suddenly except upon the advice of your doctor. Stopping this medicine too quickly may cause serious side effects or your condition may worsen. A special MedGuide will be given to you by the pharmacist with each prescription and refill. Be sure to read this information carefully each time. Talk to your pediatrician regarding the use of this medicine in children. While this drug may be prescribed for children as young as 7 years for selected conditions,  precautions do apply. Overdosage: If you think you have taken too much of this medicine contact a poison control center or emergency room at once. NOTE: This medicine is only for you. Do not share this medicine with others. What if I miss a dose? If you miss a dose, take it as soon as you can. If it is almost time for your next dose, take only that dose. Do not take double or extra doses. What may interact with this medicine? Do not take this medicine with any of the following medications:  cisapride  dronedarone  linezolid  MAOIs like Carbex, Eldepryl, Marplan, Nardil, and Parnate  methylene blue (injected into a vein)  pimozide  thioridazine This medicine may also interact with the following medications:  alcohol  amphetamines  aspirin and aspirin-like medicines  certain medicines for depression, anxiety, or psychotic disturbances  certain medicines for fungal infections like ketoconazole, fluconazole, posaconazole, and itraconazole  certain medicines for irregular heart beat like flecainide, quinidine, propafenone  certain medicines for migraine headaches like almotriptan, eletriptan, frovatriptan, naratriptan, rizatriptan, sumatriptan, zolmitriptan  certain medicines for sleep  certain medicines for seizures like carbamazepine, valproic acid, phenytoin  certain medicines that treat or prevent blood clots like warfarin, enoxaparin, dalteparin  cimetidine  digoxin  diuretics  fentanyl  isoniazid  lithium  NSAIDs, medicines for pain and inflammation, like ibuprofen or naproxen  other medicines that prolong the QT interval (cause an abnormal heart rhythm) like dofetilide  rasagiline  safinamide  supplements like St. John's wort, kava kava, valerian  tolbutamide  tramadol  tryptophan This list may not describe all possible interactions. Give your health care provider   a list of all the medicines, herbs, non-prescription drugs, or dietary supplements  you use. Also tell them if you smoke, drink alcohol, or use illegal drugs. Some items may interact with your medicine. What should I watch for while using this medicine? Tell your doctor if your symptoms do not get better or if they get worse. Visit your doctor or health care professional for regular checks on your progress. Because it may take several weeks to see the full effects of this medicine, it is important to continue your treatment as prescribed by your doctor. Patients and their families should watch out for new or worsening thoughts of suicide or depression. Also watch out for sudden changes in feelings such as feeling anxious, agitated, panicky, irritable, hostile, aggressive, impulsive, severely restless, overly excited and hyperactive, or not being able to sleep. If this happens, especially at the beginning of treatment or after a change in dose, call your health care professional. You may get drowsy or dizzy. Do not drive, use machinery, or do anything that needs mental alertness until you know how this medicine affects you. Do not stand or sit up quickly, especially if you are an older patient. This reduces the risk of dizzy or fainting spells. Alcohol may interfere with the effect of this medicine. Avoid alcoholic drinks. Your mouth may get dry. Chewing sugarless gum or sucking hard candy, and drinking plenty of water may help. Contact your doctor if the problem does not go away or is severe. What side effects may I notice from receiving this medicine? Side effects that you should report to your doctor or health care professional as soon as possible:  allergic reactions like skin rash, itching or hives, swelling of the face, lips, or tongue  anxious  black, tarry stools  changes in vision  confusion  elevated mood, decreased need for sleep, racing thoughts, impulsive behavior  eye pain  fast, irregular heartbeat  feeling faint or lightheaded, falls  feeling agitated,  angry, or irritable  hallucination, loss of contact with reality  loss of balance or coordination  loss of memory  painful or prolonged erections  restlessness, pacing, inability to keep still  seizures  stiff muscles  suicidal thoughts or other mood changes  trouble sleeping  unusual bleeding or bruising  unusually weak or tired  vomiting Side effects that usually do not require medical attention (report to your doctor or health care professional if they continue or are bothersome):  change in appetite or weight  change in sex drive or performance  diarrhea  increased sweating  indigestion, nausea  tremors This list may not describe all possible side effects. Call your doctor for medical advice about side effects. You may report side effects to FDA at 1-800-FDA-1088. Where should I keep my medicine? Keep out of the reach of children. Store at room temperature between 15 and 30 degrees C (59 and 86 degrees F). Throw away any unused medicine after the expiration date. NOTE: This sheet is a summary. It may not cover all possible information. If you have questions about this medicine, talk to your doctor, pharmacist, or health care provider.  2020 Elsevier/Gold Standard (2018-08-10 10:09:27)  

## 2020-05-25 NOTE — Progress Notes (Signed)
Established patient visit   Patient: Kendra Morton   DOB: 02-09-2001   19 y.o. Female  MRN: [id number] Visit Date: 05/25/2020  Today's healthcare provider: Margaretann Loveless, PA-C   Chief Complaint  Patient presents with  . Anxiety   Subjective    HPI   Anxiety: Patient complains of anxiety disorder and panic attacks.  She has the following symptoms: insomnia, racing thoughts.   GAD 7 : Generalized Anxiety Score 05/25/2020 10/10/2019  Nervous, Anxious, on Edge 2 2  Control/stop worrying 3 1  Worry too much - different things 3 2  Trouble relaxing 2 1  Restless 1 0  Easily annoyed or irritable 0 0  Afraid - awful might happen 1 2  Total GAD 7 Score 12 8  Anxiety Difficulty Somewhat difficult Somewhat difficult   PHQ9 SCORE ONLY 05/25/2020 10/10/2019 10/10/2019  PHQ-9 Total Score 1 0 0   Social History   Tobacco Use  . Smoking status: Never Smoker  . Smokeless tobacco: Never Used  Vaping Use  . Vaping Use: Never used  Substance Use Topics  . Alcohol use: No  . Drug use: No       Medications: Outpatient Medications Prior to Visit  Medication Sig  . albuterol (VENTOLIN HFA) 108 (90 Base) MCG/ACT inhaler Inhale 1-2 puffs into the lungs every 6 (six) hours as needed for wheezing or shortness of breath.  . fluticasone (FLOVENT HFA) 110 MCG/ACT inhaler Inhale 2 puffs into the lungs daily.  Marland Kitchen levonorgestrel (MIRENA) 20 MCG/24HR IUD 1 each by Intrauterine route once.  . nitrofurantoin, macrocrystal-monohydrate, (MACROBID) 100 MG capsule Take 1 capsule (100 mg total) by mouth 2 (two) times daily.   No facility-administered medications prior to visit.    Review of Systems  Constitutional: Negative.   Respiratory: Negative.   Cardiovascular: Positive for palpitations.  Gastrointestinal: Negative.   Neurological: Negative.   Psychiatric/Behavioral: Positive for decreased concentration and dysphoric mood. The patient is nervous/anxious.     Last CBC Lab  Results  Component Value Date   WBC 5.6 03/16/2018   HGB 13.8 03/16/2018   HCT 39.0 03/16/2018   MCV 90.7 03/16/2018   MCH 32.0 03/16/2018   RDW 13.0 03/16/2018   PLT 235 03/16/2018   Last metabolic panel Lab Results  Component Value Date   GLUCOSE 88 03/16/2018   NA 140 03/16/2018   K 3.7 03/16/2018   CL 108 03/16/2018   CO2 24 03/16/2018   BUN 9 03/16/2018   CREATININE 0.70 03/16/2018   GFRNONAA NOT CALCULATED 03/16/2018   GFRAA NOT CALCULATED 03/16/2018   CALCIUM 9.5 03/16/2018   PROT 7.6 03/16/2018   ALBUMIN 4.5 03/16/2018   BILITOT 1.2 03/16/2018   ALKPHOS 62 03/16/2018   AST 21 03/16/2018   ALT 15 03/16/2018   ANIONGAP 8 03/16/2018      Objective    BP 127/75 (BP Location: Right Arm, Patient Position: Sitting, Cuff Size: Normal)   Pulse 87   Temp 98.5 F (36.9 C) (Oral)   Wt 150 lb 12.8 oz (68.4 kg)   BMI 25.09 kg/m  BP Readings from Last 3 Encounters:  05/25/20 127/75  04/28/20 111/60  03/30/20 (!) 104/60   Wt Readings from Last 3 Encounters:  05/25/20 150 lb 12.8 oz (68.4 kg) (81 %, Z= 0.89)*  04/28/20 145 lb (65.8 kg) (76 %, Z= 0.70)*  03/30/20 148 lb (67.1 kg) (79 %, Z= 0.81)*   * Growth percentiles are based on CDC (Girls, 2-20  Years) data.      Physical Exam Vitals reviewed.  Constitutional:      General: She is not in acute distress.    Appearance: Normal appearance. She is well-developed and normal weight. She is not ill-appearing or diaphoretic.  Cardiovascular:     Rate and Rhythm: Normal rate and regular rhythm.     Heart sounds: Normal heart sounds. No murmur heard.  No friction rub. No gallop.   Pulmonary:     Effort: Pulmonary effort is normal. No respiratory distress.     Breath sounds: Normal breath sounds. No wheezing or rales.  Musculoskeletal:     Cervical back: Normal range of motion and neck supple.  Neurological:     Mental Status: She is alert.  Psychiatric:        Mood and Affect: Mood normal.        Thought  Content: Thought content normal.      No results found for any visits on 05/25/20.  Assessment & Plan     1. Need for immunization against influenza Flu vaccine given today without complication. Patient sat upright for 15 minutes to check for adverse reaction before being released. - Flu Vaccine QUAD 6+ mos PF IM (Fluarix Quad PF)  2. GAD (generalized anxiety disorder) Worsening. Will start Sertraline as below. Also referred to counseling for ADHD testing. There is family history of ADHD and she endorses more difficulty concentrating now that she is taking college courses. F/U in 4-6 weeks.  - Ambulatory referral to Psychology  3. Difficulty concentrating See above medical treatment plan. - sertraline (ZOLOFT) 50 MG tablet; Take 1 tablet (50 mg total) by mouth at bedtime. Start with 1/2 tab PO qhs x 1 week, then increase to 1 tab PO q hs  Dispense: 30 tablet; Refill: 3 - Ambulatory referral to Psychology   No follow-ups on file.      Delmer Islam, PA-C, have reviewed all documentation for this visit. The documentation on 05/29/20 for the exam, diagnosis, procedures, and orders are all accurate and complete.   Reine Just  [facility] [phone] (phone) [phone] (fax)  [facility]

## 2020-06-22 ENCOUNTER — Other Ambulatory Visit: Payer: Self-pay | Admitting: Physician Assistant

## 2020-06-22 ENCOUNTER — Telehealth (INDEPENDENT_AMBULATORY_CARE_PROVIDER_SITE_OTHER): Payer: 59 | Admitting: Physician Assistant

## 2020-06-22 DIAGNOSIS — F411 Generalized anxiety disorder: Secondary | ICD-10-CM | POA: Diagnosis not present

## 2020-06-22 DIAGNOSIS — R41840 Attention and concentration deficit: Secondary | ICD-10-CM

## 2020-06-22 MED ORDER — SERTRALINE HCL 50 MG PO TABS: 75.0000 mg | ORAL_TABLET | Freq: Every day | ORAL | 3 refills | Status: DC

## 2020-06-22 NOTE — Progress Notes (Signed)
MyChart Video Visit    Virtual Visit via Video Note   This visit type was conducted due to national recommendations for restrictions regarding the COVID-19 Pandemic (e.g. social distancing) in an effort to limit this patient's exposure and mitigate transmission in our community. This patient is at least at moderate risk for complications without adequate follow up. This format is felt to be most appropriate for this patient at this time. Physical exam was limited by quality of the video and audio technology used for the visit.   Patient location: Home Provider location: [facility]  I discussed the limitations of evaluation and management by telemedicine and the availability of in person appointments. The patient expressed understanding and agreed to proceed.  Patient: Kendra Morton   DOB: 2001-04-19   19 y.o. Female  MRN: [id number] Visit Date: 06/22/2020  Today's healthcare provider: Margaretann Loveless, PA-C   No chief complaint on file.  Subjective    HPI  Follow up for GAD  The patient was last seen for this 4-6 weeks ago. Changes made at last visit include start Sertraline and referred to counseling for ADHD testing.   She reports improvement in symptoms with the sertraline. She is sleeping better. Has not called the therapist for ADHD testing. Feels she is doing ok at this time.    Patient Active Problem List   Diagnosis Date Noted  . White coat syndrome with high blood pressure but without hypertension 08/21/2017  . Dysmenorrhea in adolescent 08/21/2017  . Mild intermittent asthma without complication 06/18/2017  . History of heart murmur in childhood 06/18/2017  . Environmental allergies 06/18/2017   Past Medical History:  Diagnosis Date  . Allergy   . Asthma   . Heart murmur    Only when she was younger  . Onychia and paronychia    right toe      Medications: Outpatient Medications Prior to Visit  Medication Sig  . albuterol (VENTOLIN HFA) 108 (90  Base) MCG/ACT inhaler Inhale 1-2 puffs into the lungs every 6 (six) hours as needed for wheezing or shortness of breath.  . fluticasone (FLOVENT HFA) 110 MCG/ACT inhaler Inhale 2 puffs into the lungs daily.  Marland Kitchen levonorgestrel (MIRENA) 20 MCG/24HR IUD 1 each by Intrauterine route once.  . nitrofurantoin, macrocrystal-monohydrate, (MACROBID) 100 MG capsule Take 1 capsule (100 mg total) by mouth 2 (two) times daily.  . sertraline (ZOLOFT) 50 MG tablet Take 1 tablet (50 mg total) by mouth at bedtime. Start with 1/2 tab PO qhs x 1 week, then increase to 1 tab PO q hs   No facility-administered medications prior to visit.    Review of Systems  Constitutional: Negative.   Respiratory: Negative.   Cardiovascular: Negative.   Psychiatric/Behavioral: Negative.     Last CBC Lab Results  Component Value Date   WBC 5.6 03/16/2018   HGB 13.8 03/16/2018   HCT 39.0 03/16/2018   MCV 90.7 03/16/2018   MCH 32.0 03/16/2018   RDW 13.0 03/16/2018   PLT 235 03/16/2018   Last metabolic panel Lab Results  Component Value Date   GLUCOSE 88 03/16/2018   NA 140 03/16/2018   K 3.7 03/16/2018   CL 108 03/16/2018   CO2 24 03/16/2018   BUN 9 03/16/2018   CREATININE 0.70 03/16/2018   GFRNONAA NOT CALCULATED 03/16/2018   GFRAA NOT CALCULATED 03/16/2018   CALCIUM 9.5 03/16/2018   PROT 7.6 03/16/2018   ALBUMIN 4.5 03/16/2018   BILITOT 1.2 03/16/2018   ALKPHOS  62 03/16/2018   AST 21 03/16/2018   ALT 15 03/16/2018   ANIONGAP 8 03/16/2018      Objective    There were no vitals taken for this visit. BP Readings from Last 3 Encounters:  05/25/20 127/75  04/28/20 111/60  03/30/20 (!) 104/60   Wt Readings from Last 3 Encounters:  05/25/20 150 lb 12.8 oz (68.4 kg) (81 %, Z= 0.89)*  04/28/20 145 lb (65.8 kg) (76 %, Z= 0.70)*  03/30/20 148 lb (67.1 kg) (79 %, Z= 0.81)*   * Growth percentiles are based on CDC (Girls, 2-20 Years) data.      Physical Exam Vitals reviewed.  Constitutional:       General: She is not in acute distress.    Appearance: Normal appearance. She is well-developed and normal weight. She is not ill-appearing.  HENT:     Head: Normocephalic and atraumatic.  Pulmonary:     Effort: Pulmonary effort is normal. No respiratory distress.  Musculoskeletal:     Cervical back: Normal range of motion and neck supple.  Neurological:     Mental Status: She is alert.  Psychiatric:        Mood and Affect: Mood normal.        Behavior: Behavior normal.        Thought Content: Thought content normal.        Judgment: Judgment normal.       Assessment & Plan     1. Generalized Anxiety Disorder Improving. Would like to increase to 75mg . Will increase as below. Call if worsening or if symptoms do not improve.  - sertraline (ZOLOFT) 50 MG tablet; Take 1.5 tablets (75 mg total) by mouth at bedtime.  Dispense: 45 tablet; Refill: 3   No follow-ups on file.     I discussed the assessment and treatment plan with the patient. The patient was provided an opportunity to ask questions and all were answered. The patient agreed with the plan and demonstrated an understanding of the instructions.   The patient was advised to call back or seek an in-person evaluation if the symptoms worsen or if the condition fails to improve as anticipated.  I provided 12 minutes of face-to-face time during this encounter via MyChart Video enabled encounter.  , PA-C, have reviewed all documentation for this visit. The documentation on 07/03/20 for the exam, diagnosis, procedures, and orders are all accurate and complete.  13/02/21 [facility] [phone] (phone) [phone] (fax)  [facility]

## 2020-06-25 DIAGNOSIS — M21622 Bunionette of left foot: Secondary | ICD-10-CM | POA: Diagnosis not present

## 2020-06-25 DIAGNOSIS — Z4889 Encounter for other specified surgical aftercare: Secondary | ICD-10-CM | POA: Diagnosis not present

## 2020-07-03 ENCOUNTER — Encounter: Payer: Self-pay | Admitting: Physician Assistant

## 2020-08-17 ENCOUNTER — Ambulatory Visit (INDEPENDENT_AMBULATORY_CARE_PROVIDER_SITE_OTHER): Payer: 59 | Admitting: Physician Assistant

## 2020-08-17 ENCOUNTER — Other Ambulatory Visit: Payer: Self-pay

## 2020-08-17 DIAGNOSIS — Z23 Encounter for immunization: Secondary | ICD-10-CM | POA: Diagnosis not present

## 2020-08-17 NOTE — Progress Notes (Signed)
Covid 19 booster Vaccine given to patient without complications. Patient sat for 15 minutes after administration and was tolerated well without adverse effects. 

## 2020-10-03 ENCOUNTER — Other Ambulatory Visit: Payer: Self-pay | Admitting: Physician Assistant

## 2020-10-03 DIAGNOSIS — F411 Generalized anxiety disorder: Secondary | ICD-10-CM

## 2020-10-03 MED ORDER — SERTRALINE HCL 50 MG PO TABS: 75.0000 mg | ORAL_TABLET | Freq: Every day | ORAL | 3 refills | Status: DC

## 2020-10-26 ENCOUNTER — Other Ambulatory Visit: Payer: Self-pay

## 2020-10-26 ENCOUNTER — Ambulatory Visit (INDEPENDENT_AMBULATORY_CARE_PROVIDER_SITE_OTHER): Payer: 59 | Admitting: Physician Assistant

## 2020-10-26 ENCOUNTER — Encounter: Payer: Self-pay | Admitting: Physician Assistant

## 2020-10-26 VITALS — BP 117/62 | HR 89 | Temp 98.6°F | Resp 16 | Ht 65.0 in | Wt 151.0 lb

## 2020-10-26 DIAGNOSIS — Z0000 Encounter for general adult medical examination without abnormal findings: Secondary | ICD-10-CM

## 2020-10-26 DIAGNOSIS — Z23 Encounter for immunization: Secondary | ICD-10-CM

## 2020-10-26 DIAGNOSIS — L700 Acne vulgaris: Secondary | ICD-10-CM | POA: Diagnosis not present

## 2020-10-26 DIAGNOSIS — W5503XA Scratched by cat, initial encounter: Secondary | ICD-10-CM | POA: Diagnosis not present

## 2020-10-26 MED ORDER — CLINDAMYCIN PHOS-BENZOYL PEROX 1-5 % EX GEL: Freq: Two times a day (BID) | CUTANEOUS | 0 refills | Status: DC

## 2020-10-26 NOTE — Patient Instructions (Signed)

## 2020-10-26 NOTE — Progress Notes (Signed)
Complete physical exam   Patient: Kendra Morton   DOB: 03/08/2001   20 y.o. Female  MRN: [id number] Visit Date: 10/26/2020  Today's healthcare provider: Mar Daring, PA-C   Chief Complaint  Patient presents with  . Annual Exam  . Acne   Subjective    Kendra Morton is a 20 y.o. female who presents today for a complete physical exam.  She reports consuming a general diet. Gym/ health club routine includes cardio, light weights and low impact aerobics. She generally feels well. She reports sleeping well. She does have additional problems to discuss today.   Patient wants to discuss treatment for acne. She reports that this is a recurrent issue.    Past Medical History:  Diagnosis Date  . Allergy   . Asthma   . Heart murmur    Only when she was younger  . Onychia and paronychia    right toe   Past Surgical History:  Procedure Laterality Date  . WEIL OSTEOTOMY Left 03/30/2020   Procedure: WEIL OSTEOTOMY/TAILOR'S BUNION LEFT;  Surgeon: Samara Deist, DPM;  Location: [facility] ORS;  Service: Podiatry;  Laterality: Left;  . WISDOM TOOTH EXTRACTION     Social History   Socioeconomic History  . Marital status: Single    Spouse name: Not on file  . Number of children: Not on file  . Years of education: 16  . Highest education level: Not on file  Occupational History  . Not on file  Tobacco Use  . Smoking status: Never Smoker  . Smokeless tobacco: Never Used  Vaping Use  . Vaping Use: Never used  Substance and Sexual Activity  . Alcohol use: No  . Drug use: No  . Sexual activity: Never  Other Topics Concern  . Not on file  Social History Narrative  . Not on file   Social Determinants of Health   Financial Resource Strain: Not on file  Food Insecurity: Not on file  Transportation Needs: Not on file  Physical Activity: Not on file  Stress: Not on file  Social Connections: Not on file  Intimate Partner Violence: Not on file   Family  Status  Relation Name Status  . Mother  Alive  . Father  Alive  . Sister  Alive  . Brother  Alive  . MGM  (Not Specified)       Reflux  . MGF  (Not Specified)  . PGM  Alive  . PGF  Deceased   Family History  Problem Relation Age of Onset  . Autism Sister   . Seizures Sister   . ADD / ADHD Brother   . Diabetes Maternal Grandfather   . Hypertension Maternal Grandfather   . Heart disease Paternal Grandfather        CHF,MI   Allergies  Allergen Reactions  . Cephalexin Rash  . Penicillins Rash    Patient Care Team: Mar Daring, PA-C as PCP - General (Family Medicine)   Medications: Outpatient Medications Prior to Visit  Medication Sig  . albuterol (VENTOLIN HFA) 108 (90 Base) MCG/ACT inhaler Inhale 1-2 puffs into the lungs every 6 (six) hours as needed for wheezing or shortness of breath.  . fluticasone (FLOVENT HFA) 110 MCG/ACT inhaler Inhale 2 puffs into the lungs daily.  Marland Kitchen levonorgestrel (MIRENA) 20 MCG/24HR IUD 1 each by Intrauterine route once.  . nitrofurantoin, macrocrystal-monohydrate, (MACROBID) 100 MG capsule Take 1 capsule (100 mg total) by mouth 2 (two) times daily.  Marland Kitchen  sertraline (ZOLOFT) 50 MG tablet Take 1.5 tablets (75 mg total) by mouth at bedtime.   No facility-administered medications prior to visit.    Review of Systems  Constitutional: Negative.   HENT: Negative.   Eyes: Negative.   Respiratory: Negative.   Cardiovascular: Negative.   Gastrointestinal: Negative.   Endocrine: Negative.   Genitourinary: Negative.   Musculoskeletal: Negative.   Skin: Negative.   Allergic/Immunologic: Negative.   Neurological: Negative.   Hematological: Negative.   Psychiatric/Behavioral: Negative.     Last CBC Lab Results  Component Value Date   WBC 5.6 03/16/2018   HGB 13.8 03/16/2018   HCT 39.0 03/16/2018   MCV 90.7 03/16/2018   MCH 32.0 03/16/2018   RDW 13.0 03/16/2018   PLT 235 77/82/4235   Last metabolic panel Lab Results  Component  Value Date   GLUCOSE 88 03/16/2018   NA 140 03/16/2018   K 3.7 03/16/2018   CL 108 03/16/2018   CO2 24 03/16/2018   BUN 9 03/16/2018   CREATININE 0.70 03/16/2018   GFRNONAA NOT CALCULATED 03/16/2018   GFRAA NOT CALCULATED 03/16/2018   CALCIUM 9.5 03/16/2018   PROT 7.6 03/16/2018   ALBUMIN 4.5 03/16/2018   BILITOT 1.2 03/16/2018   ALKPHOS 62 03/16/2018   AST 21 03/16/2018   ALT 15 03/16/2018   ANIONGAP 8 03/16/2018      Objective    BP 117/62   Pulse 89   Temp 98.6 F (37 C)   Resp 16   Ht 5' 5" (1.651 m)   Wt 151 lb (68.5 kg)   BMI 25.13 kg/m  BP Readings from Last 3 Encounters:  10/26/20 117/62  05/25/20 127/75  04/28/20 111/60   Wt Readings from Last 3 Encounters:  10/26/20 151 lb (68.5 kg) (81 %, Z= 0.86)*  05/25/20 150 lb 12.8 oz (68.4 kg) (81 %, Z= 0.89)*  04/28/20 145 lb (65.8 kg) (76 %, Z= 0.70)*   * Growth percentiles are based on CDC (Girls, 2-20 Years) data.      Physical Exam Vitals reviewed.  Constitutional:      General: She is not in acute distress.    Appearance: Normal appearance. She is well-developed, normal weight and well-nourished. She is not ill-appearing or diaphoretic.  HENT:     Head: Normocephalic and atraumatic.     Right Ear: Tympanic membrane, ear canal and external ear normal.     Left Ear: Tympanic membrane, ear canal and external ear normal.     Nose: Nose normal.     Mouth/Throat:     Mouth: Oropharynx is clear and moist. Mucous membranes are moist.     Pharynx: Oropharynx is clear. No oropharyngeal exudate.  Eyes:     General: No scleral icterus.       Right eye: No discharge.        Left eye: No discharge.     Extraocular Movements: Extraocular movements intact and EOM normal.     Conjunctiva/sclera: Conjunctivae normal.     Pupils: Pupils are equal, round, and reactive to light.  Neck:     Thyroid: No thyromegaly.     Vascular: No JVD.     Trachea: No tracheal deviation.  Cardiovascular:     Rate and Rhythm:  Normal rate and regular rhythm.     Pulses: Normal pulses and intact distal pulses.     Heart sounds: Normal heart sounds. No murmur heard. No friction rub. No gallop.   Pulmonary:     Effort: Pulmonary  effort is normal. No respiratory distress.     Breath sounds: Normal breath sounds. No wheezing or rales.  Chest:     Chest wall: No tenderness.  Abdominal:     General: Abdomen is flat. Bowel sounds are normal. There is no distension.     Palpations: Abdomen is soft. There is no mass.     Tenderness: There is no abdominal tenderness. There is no guarding or rebound.  Musculoskeletal:        General: No tenderness or edema. Normal range of motion.     Cervical back: Normal range of motion and neck supple. No tenderness.     Right lower leg: No edema.     Left lower leg: No edema.  Lymphadenopathy:     Cervical: No cervical adenopathy.  Skin:    General: Skin is warm and dry.     Capillary Refill: Capillary refill takes less than 2 seconds.     Findings: No rash.  Neurological:     General: No focal deficit present.     Mental Status: She is alert and oriented to person, place, and time. Mental status is at baseline.  Psychiatric:        Mood and Affect: Mood and affect and mood normal.        Behavior: Behavior normal.        Thought Content: Thought content normal.        Judgment: Judgment normal.     Last depression screening scores PHQ 2/9 Scores 10/26/2020 05/25/2020 10/10/2019  PHQ - 2 Score 0 0 0  PHQ- 9 Score 0 1 0   Last fall risk screening Fall Risk  10/26/2020  Falls in the past year? 0  Number falls in past yr: 0  Injury with Fall? 0  Risk for fall due to : No Fall Risks  Follow up Falls evaluation completed   Last Audit-C alcohol use screening Alcohol Use Disorder Test (AUDIT) 10/26/2020  1. How often do you have a drink containing alcohol? 0  2. How many drinks containing alcohol do you have on a typical day when you are drinking? 0  3. How often do you have  six or more drinks on one occasion? 0  AUDIT-C Score 0  Alcohol Brief Interventions/Follow-up AUDIT Score <7 follow-up not indicated   A score of 3 or more in women, and 4 or more in men indicates increased risk for alcohol abuse, EXCEPT if all of the points are from question 1   No results found for any visits on 10/26/20.  Assessment & Plan    Routine Health Maintenance and Physical Exam  Exercise Activities and Dietary recommendations Goals   None     Immunization History  Administered Date(s) Administered  . DTaP 02/25/2001, 04/27/2001, 05/30/2001, 01/10/2002, 11/11/2004  . HPV 9-valent 07/11/2015, 08/21/2017  . HPV Quadrivalent 07/06/2014  . Hepatitis A 01/02/2011, 06/08/2012  . Hepatitis B 2000/12/30, 11/30/2000, 04/27/2001  . HiB (PRP-OMP) 12/28/2000, 02/25/2001, 04/27/2001, 01/10/2002  . IPV 12/28/2000, 02/25/2001, 02/10/2003, 11/11/2004  . Influenza Nasal 04/29/2007, 06/26/2008, 07/16/2009, 06/18/2010, 05/28/2011, 06/08/2012  . Influenza,inj,Quad PF,6+ Mos 06/18/2017, 06/24/2018, 05/25/2019, 05/25/2020  . Influenza-Unspecified 05/30/2013, 07/06/2014, 06/07/2015, 07/14/2016  . MMR 11/29/2001, 11/11/2004  . Meningococcal B, OMV 06/18/2017, 08/21/2017  . Meningococcal Mcv4o 06/08/2012, 06/18/2017  . PFIZER(Purple Top)SARS-COV-2 Vaccination 12/12/2019, 01/04/2020, 08/17/2020  . Pneumococcal-Unspecified 12/28/2000, 02/25/2001, 08/06/2001  . Tdap 01/02/2011  . Varicella 11/29/2001, 11/28/2005    Health Maintenance  Topic Date Due  . Hepatitis C Screening  Never done  . HIV Screening  Never done  . TETANUS/TDAP  01/01/2021  . INFLUENZA VACCINE  Completed  . COVID-19 Vaccine  Completed    Discussed health benefits of physical activity, and encouraged her to engage in regular exercise appropriate for her age and condition.  1. Annual physical exam Normal physical exam today. Will check labs as below and f/u pending lab results. If labs are stable and WNL she will not  need to have these rechecked for one year at her next annual physical exam. She is to call the office in the meantime if she has any acute issue, questions or concerns. - CBC w/Diff/Platelet - Comprehensive Metabolic Panel (CMET) - TSH - HgB A1c - Lipid Panel With LDL/HDL Ratio  2. Acne vulgaris Worsening recently. Will try benzaclin as below. Call if not improving.  - clindamycin-benzoyl peroxide (BENZACLIN) gel; Apply topically 2 (two) times daily.  Dispense: 25 g; Refill: 0  3. Need for Tdap vaccination Tdap Vaccine given to patient without complications. Patient sat for 15 minutes after administration and was tolerated well without adverse effects. - Tdap vaccine greater than or equal to 7yo IM  4. Cat scratch Patient works for a [employer] and has cat scratches and gets small dog bites. Will update Tdap as below.  - Tdap vaccine greater than or equal to 7yo IM   No follow-ups on file.     Reynolds Bowl, PA-C, have reviewed all documentation for this visit. The documentation on 10/26/20 for the exam, diagnosis, procedures, and orders are all accurate and complete.   Rubye Beach  [facility] [phone] (phone) [phone] (fax)  [place]

## 2020-10-31 LAB — CBC WITH DIFFERENTIAL/PLATELET
Basophils Absolute: 0 10*3/uL (ref 0.0–0.2)
Basos: 1 %
EOS (ABSOLUTE): 0.1 10*3/uL (ref 0.0–0.4)
Eos: 3 %
Hematocrit: 35.7 % (ref 34.0–46.6)
Hemoglobin: 12.1 g/dL (ref 11.1–15.9)
Immature Grans (Abs): 0 10*3/uL (ref 0.0–0.1)
Immature Granulocytes: 0 %
Lymphocytes Absolute: 1.6 10*3/uL (ref 0.7–3.1)
Lymphs: 34 %
MCH: 31.1 pg (ref 26.6–33.0)
MCHC: 33.9 g/dL (ref 31.5–35.7)
MCV: 92 fL (ref 79–97)
Monocytes Absolute: 0.4 10*3/uL (ref 0.1–0.9)
Monocytes: 9 %
Neutrophils Absolute: 2.6 10*3/uL (ref 1.4–7.0)
Neutrophils: 53 %
Platelets: 290 10*3/uL (ref 150–450)
RBC: 3.89 x10E6/uL (ref 3.77–5.28)
RDW: 12.7 % (ref 11.7–15.4)
WBC: 4.8 10*3/uL (ref 3.4–10.8)

## 2020-10-31 LAB — COMPREHENSIVE METABOLIC PANEL
ALT: 29 IU/L (ref 0–32)
AST: 65 IU/L — ABNORMAL HIGH (ref 0–40)
Albumin/Globulin Ratio: 2.1 (ref 1.2–2.2)
Albumin: 4.7 g/dL (ref 3.9–5.0)
Alkaline Phosphatase: 55 IU/L (ref 42–106)
BUN/Creatinine Ratio: 10 (ref 9–23)
BUN: 9 mg/dL (ref 6–20)
Bilirubin Total: 0.4 mg/dL (ref 0.0–1.2)
CO2: 19 mmol/L — ABNORMAL LOW (ref 20–29)
Calcium: 9.5 mg/dL (ref 8.7–10.2)
Chloride: 106 mmol/L (ref 96–106)
Creatinine, Ser: 0.89 mg/dL (ref 0.57–1.00)
Globulin, Total: 2.2 g/dL (ref 1.5–4.5)
Glucose: 84 mg/dL (ref 65–99)
Potassium: 4.5 mmol/L (ref 3.5–5.2)
Sodium: 139 mmol/L (ref 134–144)
Total Protein: 6.9 g/dL (ref 6.0–8.5)
eGFR: 95 mL/min/{1.73_m2} (ref >59)

## 2020-10-31 LAB — LIPID PANEL WITH LDL/HDL RATIO
Cholesterol, Total: 137 mg/dL (ref 100–199)
HDL: 58 mg/dL (ref >39)
LDL Chol Calc (NIH): 71 mg/dL (ref 0–99)
LDL/HDL Ratio: 1.2 ratio (ref 0.0–3.2)
Triglycerides: 31 mg/dL (ref 0–149)
VLDL Cholesterol Cal: 8 mg/dL (ref 5–40)

## 2020-10-31 LAB — TSH: TSH: 1.11 u[IU]/mL (ref 0.450–4.500)

## 2020-10-31 LAB — HEMOGLOBIN A1C
Est. average glucose Bld gHb Est-mCnc: 91 mg/dL
Hgb A1c MFr Bld: 4.8 % (ref 4.8–5.6)

## 2021-01-08 ENCOUNTER — Other Ambulatory Visit: Payer: Self-pay

## 2021-01-08 DIAGNOSIS — R748 Abnormal levels of other serum enzymes: Secondary | ICD-10-CM

## 2021-01-09 LAB — COMPREHENSIVE METABOLIC PANEL
ALT: 12 IU/L (ref 0–32)
AST: 17 IU/L (ref 0–40)
Albumin/Globulin Ratio: 2.3 — ABNORMAL HIGH (ref 1.2–2.2)
Albumin: 5.2 g/dL — ABNORMAL HIGH (ref 3.9–5.0)
Alkaline Phosphatase: 56 IU/L (ref 42–106)
BUN/Creatinine Ratio: 9 (ref 9–23)
BUN: 9 mg/dL (ref 6–20)
Bilirubin Total: 0.5 mg/dL (ref 0.0–1.2)
CO2: 21 mmol/L (ref 20–29)
Calcium: 9.8 mg/dL (ref 8.7–10.2)
Chloride: 103 mmol/L (ref 96–106)
Creatinine, Ser: 0.99 mg/dL (ref 0.57–1.00)
Globulin, Total: 2.3 g/dL (ref 1.5–4.5)
Glucose: 60 mg/dL — ABNORMAL LOW (ref 65–99)
Potassium: 4.2 mmol/L (ref 3.5–5.2)
Sodium: 138 mmol/L (ref 134–144)
Total Protein: 7.5 g/dL (ref 6.0–8.5)
eGFR: 84 mL/min/{1.73_m2} (ref >59)

## 2021-02-12 ENCOUNTER — Other Ambulatory Visit (HOSPITAL_COMMUNITY): Payer: Self-pay

## 2021-02-23 IMAGING — CT CT RENAL STONE PROTOCOL
2 of 4 series · 16 of 46 positions shown, 18 images · non-contrast
Comparison: March 16, 2018

CLINICAL DATA: Right flank region pain

EXAM:
CT ABDOMEN AND PELVIS WITHOUT CONTRAST
TECHNIQUE: Multidetector CT imaging of the abdomen and pelvis was performed
following the standard protocol without oral or IV contrast.

[Series 2: renal stone · axial · 0.62mm/px · z∈[-1450,-1065]mm · 13 of 85 slices shown, 15 images (1 of 2)]
[im 4/85  soft-tissue]
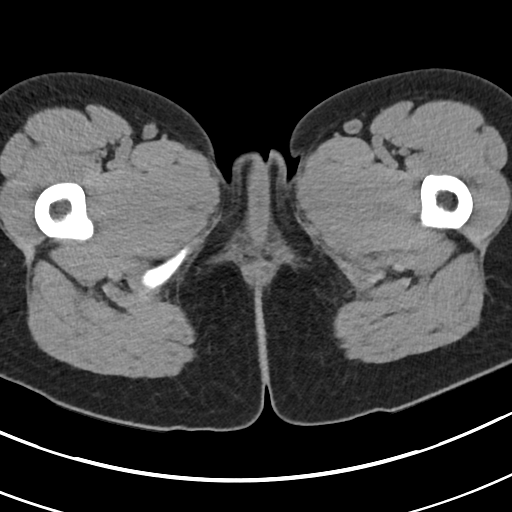
[im 4/85  bone]
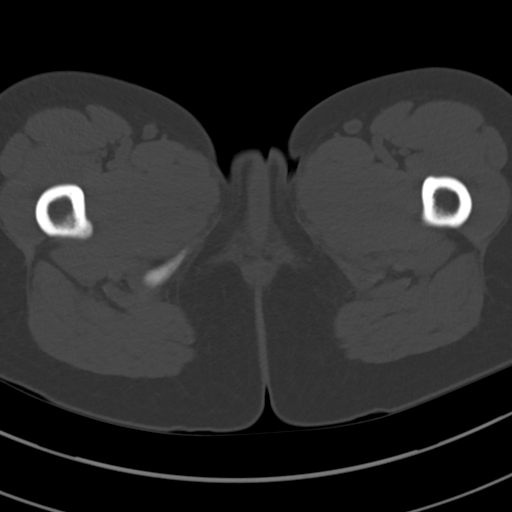
[im 11/85  soft-tissue]
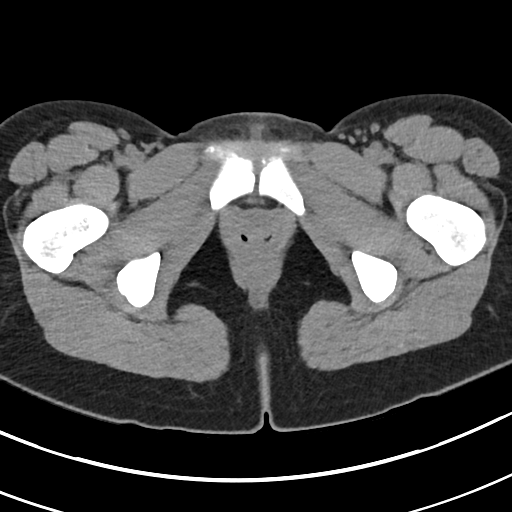
[im 18/85  soft-tissue]
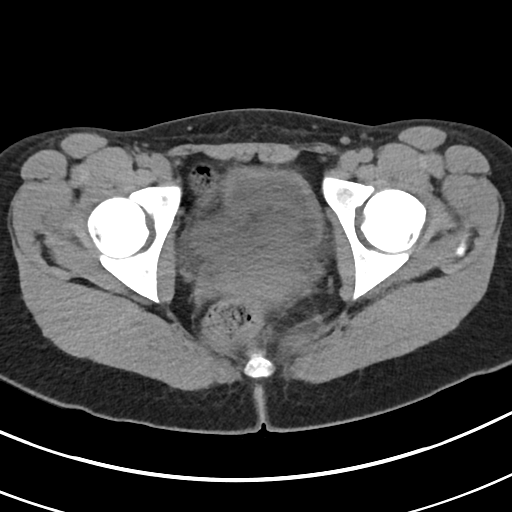
[im 25/85  soft-tissue]
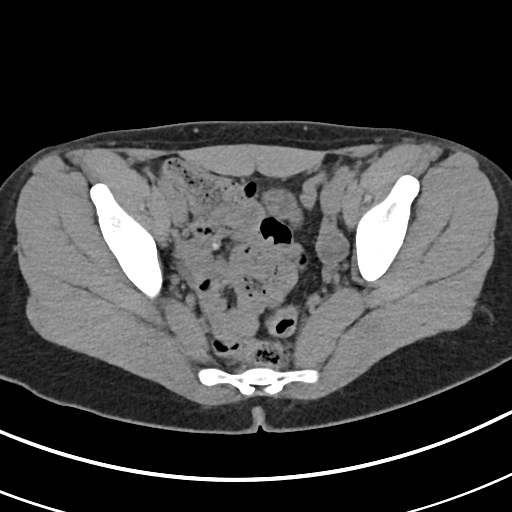
[im 29/85  soft-tissue]
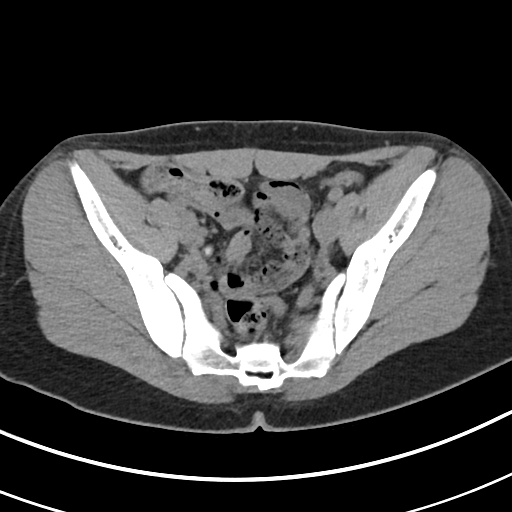
[im 36/85  soft-tissue]
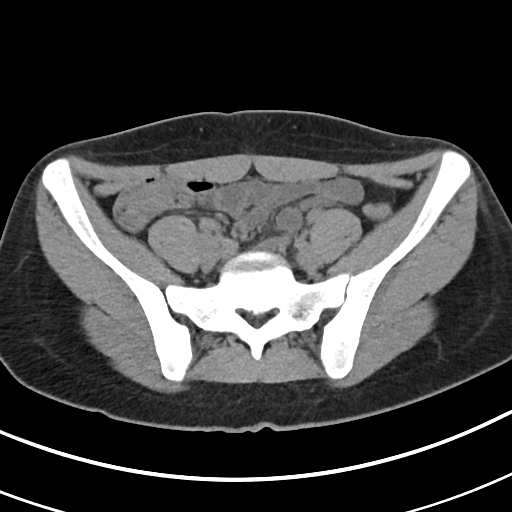
[im 43/85  soft-tissue]
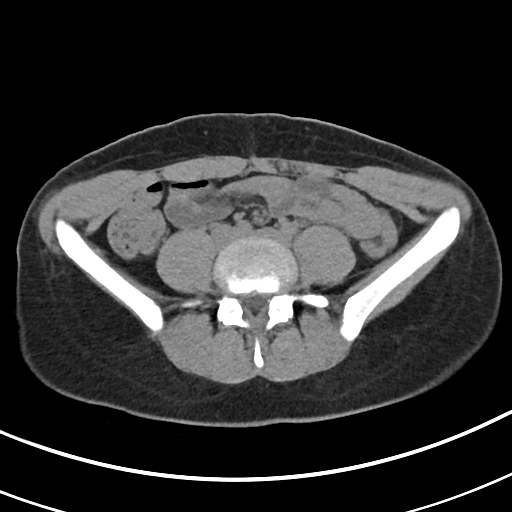
[im 50/85  soft-tissue]
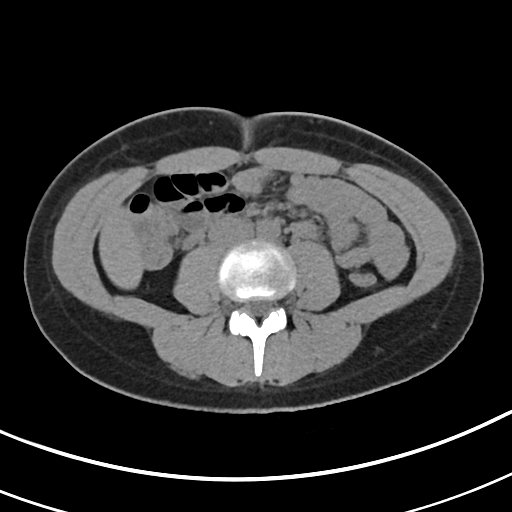
[im 57/85  soft-tissue]
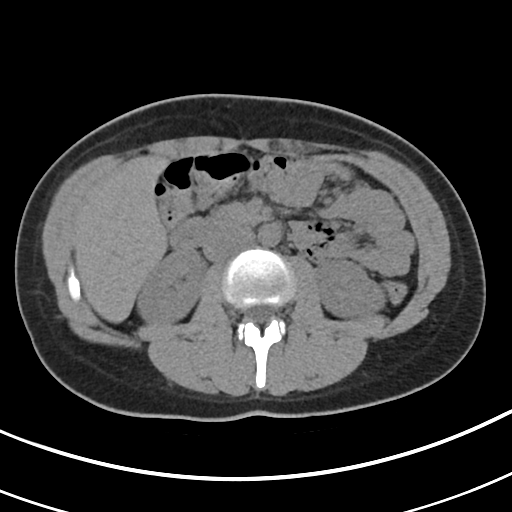
[im 57/85  bone]
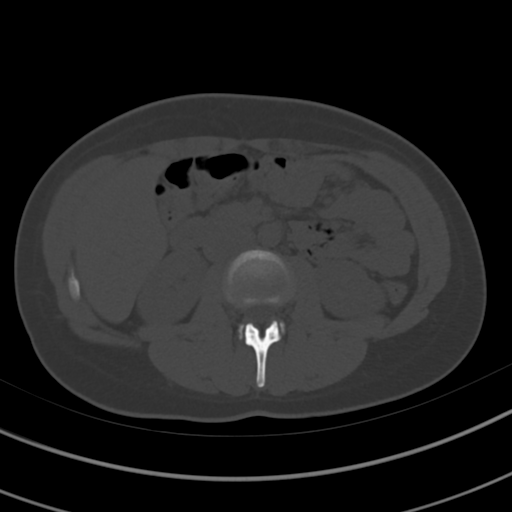
[im 60/85  soft-tissue]
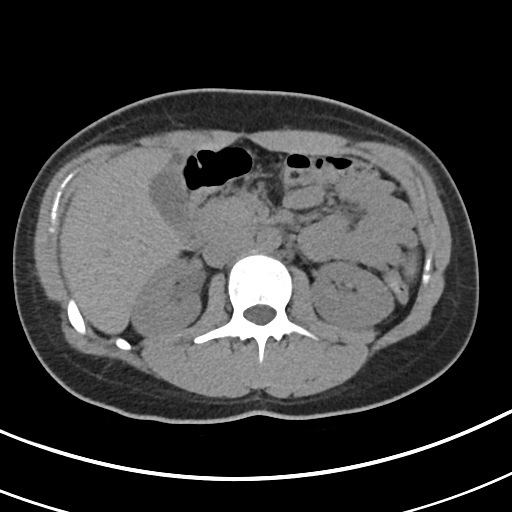
[im 67/85  soft-tissue]
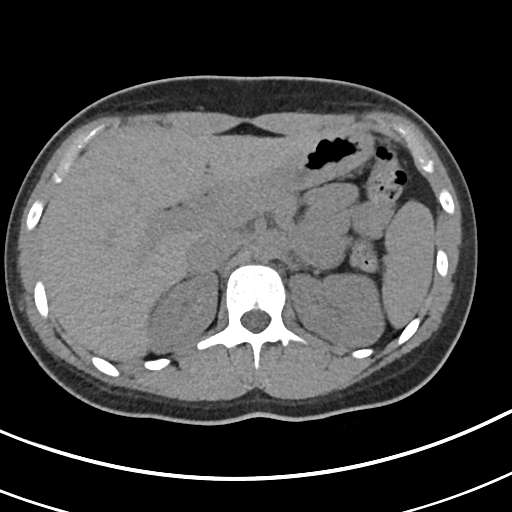
[im 74/85  soft-tissue]
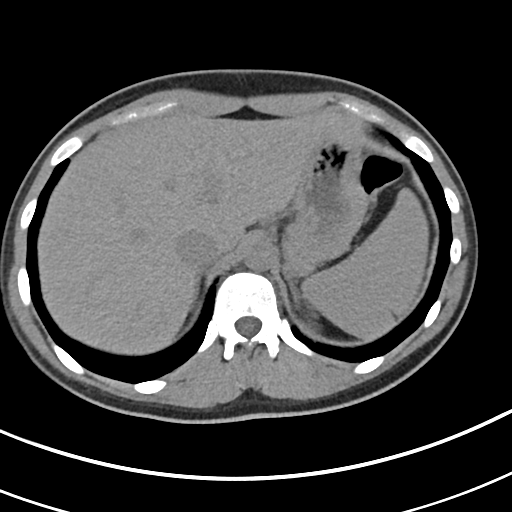
[im 81/85  soft-tissue]
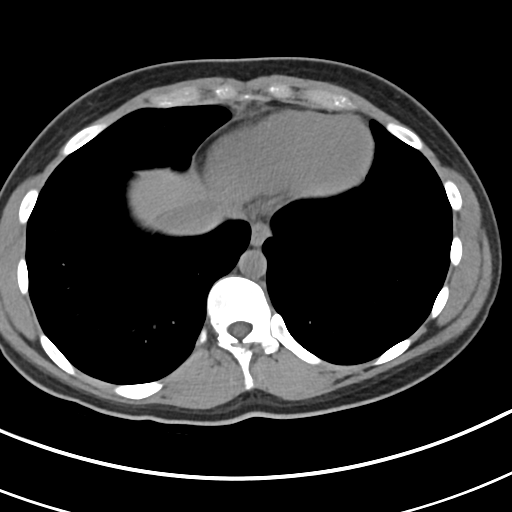

[Series 4: renal stone · coronal · 0.62mm/px · 3 of 121 slices shown (2 of 2)]
[im 41/121  soft-tissue]
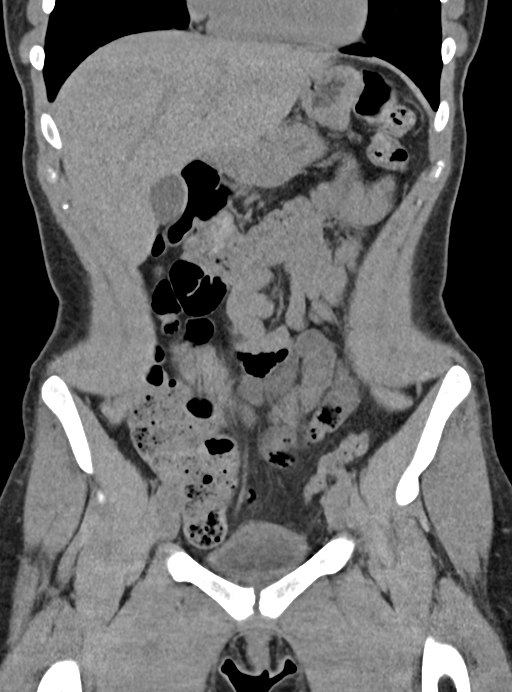
[im 54/121  soft-tissue]
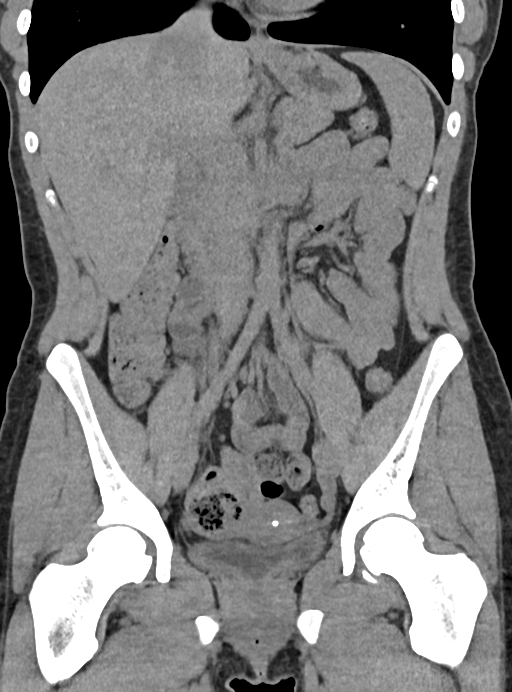
[im 67/121  soft-tissue]
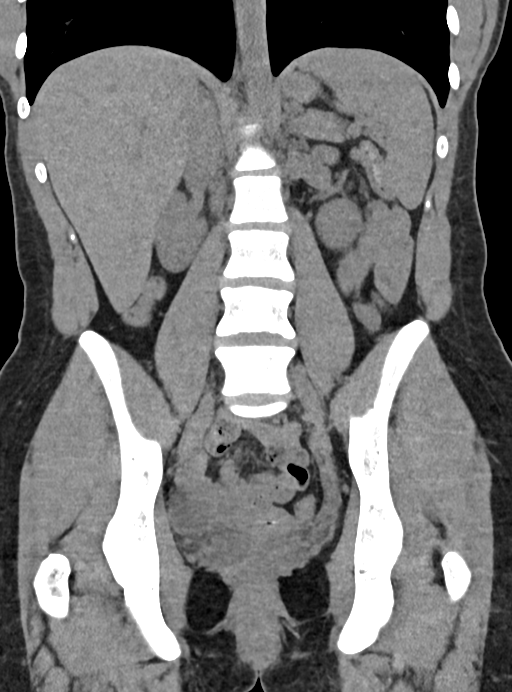

[16 of 46 positions shown; findings below may reference images not displayed]

FINDINGS: Lower chest: Lung bases are clear.

Hepatobiliary: No focal liver lesions are evident on this
noncontrast enhanced study. The gallbladder wall is not appreciably
thickened. There is no biliary duct dilatation.

Pancreas: There is no pancreatic mass or inflammatory focus.

Spleen: No splenic lesions are evident.

Adrenals/Urinary Tract: Adrenals bilaterally appear unremarkable.
Kidneys bilaterally show no evident mass or hydronephrosis on either
side. There is no demonstrable renal or ureteral calculus on either
side. Urinary bladder wall appears mildly thickened.

Stomach/Bowel: There is no appreciable bowel wall or mesenteric
thickening. There is no appreciable bowel obstruction. Terminal
ileum appears normal. No free air or portal venous air.

Vascular/Lymphatic: There is no abdominal aortic aneurysm. No
vascular lesions are evident on this noncontrast enhanced study.
There is a circumaortic left renal vein, an anatomic variant. There
is no adenopathy in the abdomen or pelvis.

Reproductive: Uterus is anteverted. Intrauterine device is
positioned within the endometrium. No pelvic mass evident. There are
apparent follicles in each ovary.

Other: Appendix appears normal. There is no abscess or ascites in
the abdomen or pelvis.

Musculoskeletal: No blastic or lytic bone lesions. There is no
intramuscular or abdominal wall lesion.
IMPRESSION: 1. There is thickening of the urinary bladder wall, a finding most
likely indicative of a degree of cystitis.

2.  No renal or ureteral calculus.  No hydronephrosis.

3. No bowel wall thickening or bowel obstruction. No abscess in the
abdomen or pelvis. Appendix appears normal.

4.  Intrauterine device positioned within the endometrium.

## 2021-02-25 ENCOUNTER — Other Ambulatory Visit: Payer: Self-pay | Admitting: Physician Assistant

## 2021-02-25 DIAGNOSIS — L700 Acne vulgaris: Secondary | ICD-10-CM

## 2021-02-26 MED ORDER — CLINDAMYCIN PHOS-BENZOYL PEROX 1-5 % EX GEL: Freq: Two times a day (BID) | CUTANEOUS | 0 refills | Status: DC

## 2021-05-10 ENCOUNTER — Other Ambulatory Visit: Payer: Self-pay

## 2021-05-10 DIAGNOSIS — S50811A Abrasion of right forearm, initial encounter: Secondary | ICD-10-CM | POA: Insufficient documentation

## 2021-05-10 DIAGNOSIS — S60811A Abrasion of right wrist, initial encounter: Secondary | ICD-10-CM | POA: Diagnosis not present

## 2021-05-10 DIAGNOSIS — S90511A Abrasion, right ankle, initial encounter: Secondary | ICD-10-CM | POA: Insufficient documentation

## 2021-05-10 DIAGNOSIS — R519 Headache, unspecified: Secondary | ICD-10-CM | POA: Diagnosis not present

## 2021-05-10 DIAGNOSIS — Z7951 Long term (current) use of inhaled steroids: Secondary | ICD-10-CM | POA: Insufficient documentation

## 2021-05-10 DIAGNOSIS — J45909 Unspecified asthma, uncomplicated: Secondary | ICD-10-CM | POA: Diagnosis not present

## 2021-05-10 DIAGNOSIS — Y92410 Unspecified street and highway as the place of occurrence of the external cause: Secondary | ICD-10-CM | POA: Diagnosis not present

## 2021-05-10 DIAGNOSIS — S99911A Unspecified injury of right ankle, initial encounter: Secondary | ICD-10-CM | POA: Diagnosis present
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
## 2021-05-11 ENCOUNTER — Encounter: Payer: Self-pay | Admitting: Radiology

## 2021-05-11 ENCOUNTER — Emergency Department
Admission: EM | Admit: 2021-05-11 | Discharge: 2021-05-11 | Disposition: A | Discharge status: Home / Self Care | Payer: 59 | Attending: Emergency Medicine | Admitting: Emergency Medicine

## 2021-05-11 ENCOUNTER — Emergency Department: Payer: 59

## 2021-05-11 DIAGNOSIS — T07XXXA Unspecified multiple injuries, initial encounter: Secondary | ICD-10-CM
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
## 2021-05-13 ENCOUNTER — Ambulatory Visit (INDEPENDENT_AMBULATORY_CARE_PROVIDER_SITE_OTHER): Payer: 59 | Admitting: Family Medicine

## 2021-05-13 ENCOUNTER — Other Ambulatory Visit: Payer: Self-pay

## 2021-05-13 ENCOUNTER — Encounter: Payer: Self-pay | Admitting: Family Medicine

## 2021-05-13 DIAGNOSIS — M79671 Pain in right foot: Secondary | ICD-10-CM | POA: Diagnosis not present

## 2021-05-13 DIAGNOSIS — S060X1A Concussion with loss of consciousness of 30 minutes or less, initial encounter: Secondary | ICD-10-CM | POA: Diagnosis not present
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
## 2021-06-25 ENCOUNTER — Other Ambulatory Visit: Payer: Self-pay | Admitting: Family Medicine

## 2021-06-25 DIAGNOSIS — L700 Acne vulgaris: Secondary | ICD-10-CM

## 2021-06-25 NOTE — Telephone Encounter (Signed)
Last refill: 02/26/2021 qty 25g with no refills  Last office visit 10/26/2020 Next office visit: none scheduled

## 2021-06-26 MED ORDER — CLINDAMYCIN PHOS-BENZOYL PEROX 1-5 % EX GEL: Freq: Two times a day (BID) | CUTANEOUS | 5 refills | Status: DC

## 2021-07-09 ENCOUNTER — Other Ambulatory Visit: Payer: Self-pay | Admitting: *Deleted

## 2021-07-09 ENCOUNTER — Telehealth: Payer: Self-pay | Admitting: *Deleted

## 2021-07-09 DIAGNOSIS — J101 Influenza due to other identified influenza virus with other respiratory manifestations: Secondary | ICD-10-CM

## 2021-07-09 DIAGNOSIS — B338 Other specified viral diseases: Secondary | ICD-10-CM

## 2021-07-09 MED ORDER — OSELTAMIVIR PHOSPHATE 75 MG PO CAPS: 75.0000 mg | ORAL_CAPSULE | Freq: Two times a day (BID) | ORAL | 1 refills | Status: DC

## 2021-07-09 NOTE — Telephone Encounter (Signed)
Patient tested positive for Flu A and RSV yesterday. Patient's mother is requesting an rx for tamiflu be sent to [facility]. Please advise?

## 2021-07-09 NOTE — Telephone Encounter (Signed)
Noted  

## 2021-07-09 NOTE — Telephone Encounter (Signed)
Per Dr. Sullivan Lone, sent Tamiflu 75 mg bid x 5 days.

## 2021-07-15 ENCOUNTER — Ambulatory Visit
Admission: EM | Admit: 2021-07-15 | Discharge: 2021-07-15 | Disposition: A | Discharge status: Home / Self Care | Payer: 59 | Attending: Emergency Medicine | Admitting: Emergency Medicine

## 2021-07-15 ENCOUNTER — Other Ambulatory Visit: Payer: Self-pay

## 2021-07-15 ENCOUNTER — Encounter: Payer: Self-pay | Admitting: Emergency Medicine

## 2021-07-15 DIAGNOSIS — R0982 Postnasal drip: Secondary | ICD-10-CM

## 2021-07-15 DIAGNOSIS — J029 Acute pharyngitis, unspecified: Secondary | ICD-10-CM | POA: Diagnosis not present

## 2021-07-15 LAB — GROUP A STREP BY PCR: Group A Strep by PCR: NOT DETECTED

## 2021-07-15 MED ORDER — IPRATROPIUM BROMIDE 0.06 % NA SOLN: 2.0000 | Freq: Four times a day (QID) | NASAL | 12 refills | Status: DC

## 2021-07-15 NOTE — ED Provider Notes (Signed)
[facility]    CSN: [id number] Arrival date & time: 07/15/21  1543      History   Chief Complaint Chief Complaint  Patient presents with   Sore Throat    HPI Kendra Morton is a 20 y.o. female.   HPI  20 year old female here for evaluation of sore throat.  She reports that she has been experiencing a sharp sore throat for the past 2 days.  She denies any fever, runny nose, nasal congestion.  She was treated for flu and RSV 1 week ago.  She is concerned because the night before her symptoms started a can of spray case of exploded and she inhaled some of the adhesive residue.  She denies any cough or shortness of breath.  Past Medical History:  Diagnosis Date   Allergy    Asthma    Heart murmur    Only when she was younger   Onychia and paronychia    right toe    Patient Active Problem List   Diagnosis Date Noted   White coat syndrome with high blood pressure but without hypertension 08/21/2017   Dysmenorrhea in adolescent 08/21/2017   Mild intermittent asthma without complication 06/18/2017   History of heart murmur in childhood 06/18/2017   Environmental allergies 06/18/2017    Past Surgical History:  Procedure Laterality Date   WEIL OSTEOTOMY Left 03/30/2020   Procedure: WEIL OSTEOTOMY/TAILOR'S BUNION LEFT;  Surgeon: [name], DPM;  Location: [facility] ORS;  Service: Podiatry;  Laterality: Left;   WISDOM TOOTH EXTRACTION      OB History   No obstetric history on file.      Home Medications    Prior to Admission medications   Medication Sig Start Date End Date Taking? Authorizing Provider  ipratropium (ATROVENT) 0.06 % nasal spray Place 2 sprays into both nostrils 4 (four) times daily. 07/15/21  Yes Becky Augusta, NP  albuterol (VENTOLIN HFA) 108 (90 Base) MCG/ACT inhaler Inhale 1-2 puffs into the lungs every 6 (six) hours as needed for wheezing or shortness of breath. 07/25/19   Margaretann Loveless, PA-C  clindamycin-benzoyl peroxide  (BENZACLIN) gel Apply topically 2 (two) times daily. 06/26/21   Maple Hudson., MD  fluticasone (FLOVENT HFA) 110 MCG/ACT inhaler Inhale 2 puffs into the lungs daily. 07/25/19   Margaretann Loveless, PA-C  levonorgestrel (MIRENA) 20 MCG/24HR IUD 1 each by Intrauterine route once.    [provider]  sertraline (ZOLOFT) 50 MG tablet Take 1.5 tablets (75 mg total) by mouth at bedtime. 10/03/20   Margaretann Loveless, PA-C    Family History Family History  Problem Relation Age of Onset   Autism Sister    Seizures Sister    ADD / ADHD Brother    Diabetes Maternal Grandfather    Hypertension Maternal Grandfather    Heart disease Paternal Grandfather        CHF,MI    Social History Social History   Tobacco Use   Smoking status: Never   Smokeless tobacco: Never  Vaping Use   Vaping Use: Never used  Substance Use Topics   Alcohol use: No   Drug use: No     Allergies   Cephalexin and Penicillins   Review of Systems Review of Systems  Constitutional:  Negative for activity change, appetite change and fever.  HENT:  Positive for sore throat. Negative for congestion, ear pain and rhinorrhea.   Respiratory:  Negative for cough and shortness of breath.   Gastrointestinal:  Negative for diarrhea, nausea and vomiting.  Skin:  Negative for rash.  Hematological: Negative.   Psychiatric/Behavioral: Negative.      Physical Exam Triage Vital Signs ED Triage Vitals  Enc Vitals Group     BP 07/15/21 1653 127/70     Pulse Rate 07/15/21 1653 78     Resp 07/15/21 1653 18     Temp 07/15/21 1653 98.7 F (37.1 C)     Temp Source 07/15/21 1653 Oral     SpO2 07/15/21 1653 99 %     Weight --      Height --      Head Circumference --      Peak Flow --      Pain Score 07/15/21 1647 5     Pain Loc --      Pain Edu? --      Excl. in GC? --    No data found.  Updated Vital Signs BP 127/70 (BP Location: Right Arm)   Pulse 78   Temp 98.7 F (37.1 C) (Oral)   Resp 18    SpO2 99%   Visual Acuity Right Eye Distance:   Left Eye Distance:   Bilateral Distance:    Right Eye Near:   Left Eye Near:    Bilateral Near:     Physical Exam Vitals and nursing note reviewed.  Constitutional:      General: She is not in acute distress.    Appearance: Normal appearance. She is normal weight. She is not ill-appearing.  HENT:     Head: Normocephalic and atraumatic.     Right Ear: Tympanic membrane, ear canal and external ear normal. There is no impacted cerumen.     Left Ear: Tympanic membrane, ear canal and external ear normal. There is no impacted cerumen.     Nose: Congestion and rhinorrhea present.     Mouth/Throat:     Mouth: Mucous membranes are moist.     Pharynx: Oropharynx is clear. Posterior oropharyngeal erythema present.  Cardiovascular:     Rate and Rhythm: Normal rate and regular rhythm.     Pulses: Normal pulses.     Heart sounds: Normal heart sounds. No murmur heard.   No gallop.  Pulmonary:     Effort: Pulmonary effort is normal.     Breath sounds: Normal breath sounds. No wheezing, rhonchi or rales.  Musculoskeletal:     Cervical back: Normal range of motion and neck supple.  Lymphadenopathy:     Cervical: No cervical adenopathy.  Skin:    General: Skin is warm and dry.     Capillary Refill: Capillary refill takes less than 2 seconds.     Findings: No erythema or rash.  Neurological:     General: No focal deficit present.     Mental Status: She is alert and oriented to person, place, and time.  Psychiatric:        Mood and Affect: Mood normal.        Behavior: Behavior normal.        Thought Content: Thought content normal.        Judgment: Judgment normal.     UC Treatments / Results  Labs (all labs ordered are listed, but only abnormal results are displayed) Labs Reviewed  GROUP A STREP BY PCR    EKG   Radiology No results found.  Procedures Procedures (including critical care time)  Medications Ordered in  UC Medications - No data to display  Initial Impression / Assessment  and Plan / UC Course  I have reviewed the triage vital signs and the nursing notes.  Pertinent labs & imaging results that were available during my care of the patient were reviewed by me and considered in my medical decision making (see chart for details).  Patient is a nontoxic-appearing 20 year old female here for evaluation of sore throat that is being on 2 days and is not associated with any other upper respiratory symptoms.  Patient is still recovering from RSV and flu that she had a week ago.  Patient reports that she is hoping that it might be postnasal drip but she is also concerned because she had a can of spray adhesive exploded in front of her and she inhaled some of the spray adhesive.  She was to make sure that she does not have some sort of chemical exposure.  Patient's physical exam reveals protegrin tympanic membranes bilaterally with normal light reflex and clear external auditory canals.  Nasal mucosa is mildly erythematous and edematous with scant clear nasal discharge.  Oropharyngeal exam reveals posterior oropharyngeal erythema and mild injection with clear postnasal drip.  No erythema or edema of tonsillar pillars and no tonsillar exudate noted on exam.  No anterior cervical lymphadenopathy appreciated on exam.  Cardiopulmonary exam reveals clear lung sounds in all fields.  Strep PCR was collected at triage and is pending.  Strep PCR is negative.  I believe the patient's sore throat is coming from postnasal drip.  Discharge patient home on Atrovent nasal spray 4 times daily as needed to help with postnasal drip.   Final Clinical Impressions(s) / UC Diagnoses   Final diagnoses:  Sore throat  Postnasal drip   Discharge Instructions   None    ED Prescriptions     Medication Sig Dispense Auth. Provider   ipratropium (ATROVENT) 0.06 % nasal spray Place 2 sprays into both nostrils 4 (four) times daily.  15 mL Becky Augusta, NP      PDMP not reviewed this encounter.   Becky Augusta, NP 07/15/21 [phone]

## 2021-07-15 NOTE — ED Triage Notes (Signed)
Pt c/o sore throat x 2 days, denies fever. She does report being dx with both Flu and RSV one week ago.

## 2021-07-15 NOTE — Discharge Instructions (Addendum)
Your throat swab today did not indicate the presence of strep.  I believe your sore throat is coming from postnasal drip that is a hold over from your recent respiratory infection.  Use the Atrovent nasal spray, 2 squirts up each nostril every 6 hours, as needed for postnasal drip.  You may gargle with warm salt water 2-3 times a day to help wash away drainage and soothe your throat.  You may also use over-the-counter Chloraseptic or Sucrets lozenges, 1 lozenge every 2 hours, as needed for pain relief.  Return for reevaluation for new or worsening symptoms.

## 2021-10-01 ENCOUNTER — Other Ambulatory Visit: Payer: Self-pay | Admitting: Physician Assistant

## 2021-10-01 DIAGNOSIS — F411 Generalized anxiety disorder: Secondary | ICD-10-CM

## 2021-12-05 ENCOUNTER — Ambulatory Visit: Payer: 59 | Admitting: Family Medicine

## 2021-12-05 ENCOUNTER — Encounter: Payer: Self-pay | Admitting: Physician Assistant

## 2021-12-05 ENCOUNTER — Ambulatory Visit (INDEPENDENT_AMBULATORY_CARE_PROVIDER_SITE_OTHER): Payer: 59 | Admitting: Physician Assistant

## 2021-12-05 VITALS — BP 126/77 | HR 77 | Ht 65.0 in | Wt 159.5 lb

## 2021-12-05 DIAGNOSIS — M25551 Pain in right hip: Secondary | ICD-10-CM | POA: Diagnosis not present

## 2021-12-05 DIAGNOSIS — F419 Anxiety disorder, unspecified: Secondary | ICD-10-CM | POA: Insufficient documentation

## 2021-12-05 DIAGNOSIS — J4520 Mild intermittent asthma, uncomplicated: Secondary | ICD-10-CM

## 2021-12-05 DIAGNOSIS — Z0000 Encounter for general adult medical examination without abnormal findings: Secondary | ICD-10-CM | POA: Diagnosis not present

## 2021-12-05 DIAGNOSIS — Z114 Encounter for screening for human immunodeficiency virus [HIV]: Secondary | ICD-10-CM

## 2021-12-05 DIAGNOSIS — Z1159 Encounter for screening for other viral diseases: Secondary | ICD-10-CM

## 2021-12-05 NOTE — Assessment & Plan Note (Signed)
Stable on sertraline 50 mg, continue ?Does not currently have a therapist, not interest at this time.  ?

## 2021-12-05 NOTE — Assessment & Plan Note (Signed)
Advised to start with xray R hip  ?Likely send to PT, consider ortho referral ?

## 2021-12-05 NOTE — Assessment & Plan Note (Signed)
Well controlled, does not find the need for her flovent or albuterol inhaler.  ?

## 2021-12-05 NOTE — Progress Notes (Signed)
? ?I,Sofi Bryars,acting as a scribe for [employer], PA-C.,have documented all relevant documentation on the behalf of [name], PA-C,as directed by  [name], PA-C while in the presence of [name], PA-C. ? ? ?Complete physical exam ? ? ?Patient: Kendra Morton   DOB: Jul 25, 2001   21 y.o. Female  MRN: [id number] ?Visit Date: 12/05/2021 ? ?Today's healthcare provider: [name], PA-C  ? ?Cc. cpe ? ?Subjective  ?  ?Kendra Morton is a 21 y.o. female who presents today for a complete physical exam.  ?She reports consuming a  currently vegetarian diet for Kendra Morton but usually a in general  diet. Kendra Morton date started late February and ends on Easter.  The patient reports lifting at the gym 5 days a week for at least a hour and a half. She also plays tennis 5 days a week for at least a hour and a half.  She generally feels well. She reports sleeping well. She does have additional problems to discuss today.  ?HPI  ? ?Reports right hip pain and tightness, if she pops it the tightness and pain is relieved. If she doesn't, pain/pressure progresses down her right leg. Denies edema, paresthesias, denies injury.  ? ? ?  12/05/2021  ?  2:17 PM 05/25/2020  ?  2:06 PM 10/10/2019  ?  5:42 PM  ?GAD 7 : Generalized Anxiety Score  ?Nervous, Anxious, on Edge 1 2 2   ?Control/stop worrying 1 3 1   ?Worry too much - different things 1 3 2   ?Trouble relaxing 0 2 1  ?Restless 1 1 0  ?Easily annoyed or irritable 0 0 0  ?Afraid - awful might happen 0 1 2  ?Total GAD 7 Score 4 12 8   ?Anxiety Difficulty Not difficult at all Somewhat difficult Somewhat difficult  ? ? ?Past Medical History:  ?Diagnosis Date  ? Allergy   ? Asthma   ? Heart murmur   ? Only when she was younger  ? Onychia and paronychia   ? right toe  ? ?Past Surgical History:  ?Procedure Laterality Date  ? WEIL OSTEOTOMY Left 03/30/2020  ? Procedure: WEIL OSTEOTOMY/TAILOR'S BUNION LEFT;  Surgeon: Samara Deist, DPM;  Location: [facility] OR[facility];  Service:  Podiatry;  Laterality: Left;  ? WISDOM TOOTH EXTRACTION    ? ?Social History  ? ?Socioeconomic History  ? Marital status: Single  ?  Spouse name: Not on file  ? Number of children: Not on file  ? Years of education: 81  ? Highest education level: Not on file  ?Occupational History  ? Not on file  ?Tobacco Use  ? Smoking status: Never  ? Smokeless tobacco: Never  ?Vaping Use  ? Vaping Use: Never used  ?Substance and Sexual Activity  ? Alcohol use: No  ? Drug use: No  ? Sexual activity: Never  ?Other Topics Concern  ? Not on file  ?Social History Narrative  ? Not on file  ? ?Social Determinants of Health  ? ?Financial Resource Strain: Not on file  ?Food Insecurity: Not on file  ?Transportation Needs: Not on file  ?Physical Activity: Not on file  ?Stress: Not on file  ?Social Connections: Not on file  ?Intimate Partner Violence: Not on file  ? ?Family Status  ?Relation Name Status  ? Mother  Alive  ? Father  Alive  ? Sister  Alive  ? Brother  Alive  ? MGM  (Not Specified)  ?     Reflux  ? MGF  (Not Specified)  ?  PGM  Alive  ? PGF  Deceased  ? ?Family History  ?Problem Relation Age of Onset  ? Autism Sister   ? Seizures Sister   ? ADD / ADHD Brother   ? Diabetes Maternal Grandfather   ? Hypertension Maternal Grandfather   ? Heart disease Paternal Grandfather   ?     CHF,MI  ? ?Allergies  ?Allergen Reactions  ? Cephalexin Rash  ? Penicillins Rash  ?  ?Patient Care Team: ?Emelia Loron as PCP - General (Physician Assistant)  ? ?Medications: ?Outpatient Medications Prior to Visit  ?Medication Sig  ? albuterol (VENTOLIN HFA) 108 (90 Base) MCG/ACT inhaler Inhale 1-2 puffs into the lungs every 6 (six) hours as needed for wheezing or shortness of breath.  ? clindamycin-benzoyl peroxide (BENZACLIN) gel Apply topically 2 (two) times daily.  ? fluticasone (FLOVENT HFA) 110 MCG/ACT inhaler Inhale 2 puffs into the lungs daily.  ? levonorgestrel (MIRENA) 20 MCG/24HR IUD 1 each by Intrauterine route once.  ? sertraline  (ZOLOFT) 50 MG tablet TAKE 1.5 TABLETS BY MOUTH AT BEDTIME.  ? [DISCONTINUED] ipratropium (ATROVENT) 0.06 % nasal spray Place 2 sprays into both nostrils 4 (four) times daily.  ? ?No facility-administered medications prior to visit.  ? ? ?Review of Systems  ?Constitutional: Negative.  Negative for fatigue and fever.  ?Eyes: Negative.   ?Respiratory:  Negative for cough and shortness of breath.   ?Cardiovascular: Negative.  Negative for chest pain and leg swelling.  ?Gastrointestinal: Negative.  Negative for abdominal pain.  ?Endocrine: Negative.   ?Genitourinary: Negative.   ?Musculoskeletal:  Positive for arthralgias.  ?Skin: Negative.   ?Allergic/Immunologic: Negative.   ?Neurological: Negative.  Negative for dizziness and headaches.  ?Hematological: Negative.   ?Psychiatric/Behavioral: Negative.    ? ? Objective  ?Blood pressure 126/77, pulse 77, height $RemoveBe'5\' 5"'fqSqhvlZC$  (1.651 m), weight 159 lb 8 oz (72.3 kg), last menstrual period 12/03/2021, SpO2 99 %.  ? ?Physical Exam ?Constitutional:   ?   General: She is awake.  ?   Appearance: She is well-developed.  ?HENT:  ?   Head: Normocephalic.  ?   Right Ear: Tympanic membrane normal.  ?   Left Ear: Tympanic membrane normal.  ?Eyes:  ?   Conjunctiva/sclera: Conjunctivae normal.  ?   Pupils: Pupils are equal, round, and reactive to light.  ?Neck:  ?   Thyroid: No thyroid mass or thyromegaly.  ?Cardiovascular:  ?   Rate and Rhythm: Normal rate and regular rhythm.  ?   Heart sounds: Normal heart sounds.  ?Pulmonary:  ?   Effort: Pulmonary effort is normal.  ?   Breath sounds: Normal breath sounds.  ?Abdominal:  ?   Palpations: Abdomen is soft.  ?   Tenderness: There is no abdominal tenderness.  ?Musculoskeletal:  ?   Right lower leg: No swelling. No edema.  ?   Left lower leg: No swelling. No edema.  ?Lymphadenopathy:  ?   Cervical: No cervical adenopathy.  ?Skin: ?   General: Skin is warm.  ?Neurological:  ?   Mental Status: She is alert and oriented to person, place, and time.   ?Psychiatric:     ?   Attention and Perception: Attention normal.     ?   Mood and Affect: Mood normal.     ?   Speech: Speech normal.     ?   Behavior: Behavior is cooperative.  ?  ? ?Last depression screening scores ? ?  12/05/2021  ?  2:25 PM  10/26/2020  ?  2:52 PM 05/25/2020  ?  2:06 PM  ?PHQ 2/9 Scores  ?PHQ - 2 Score 0 0 0  ?PHQ- 9 Score 0 0 1  ? ?Last fall risk screening ? ?  12/05/2021  ?  2:25 PM  ?Fall Risk   ?Falls in the past year? 0  ?Number falls in past yr: 0  ?Injury with Fall? 0  ?Risk for fall due to : No Fall Risks  ? ?Last Audit-C alcohol use screening ? ?  12/05/2021  ?  2:26 PM  ?Alcohol Use Disorder Test (AUDIT)  ?1. How often do you have a drink containing alcohol? 1  ?2. How many drinks containing alcohol do you have on a typical day when you are drinking? 0  ?3. How often do you have six or more drinks on one occasion? 0  ?AUDIT-C Score 1  ? ?A score of 3 or more in women, and 4 or more in men indicates increased risk for alcohol abuse, EXCEPT if all of the points are from question 1  ? ?No results found for any visits on 12/05/21. ? Assessment & Plan  ?  ?Routine Health Maintenance and Physical Exam ? ?Exercise Activities and Dietary recommendations ?--balanced diet high in fiber and protein, low in sugars, carbs, fats. ?--physical activity/exercise 30 minutes 3-5 times a week  ? ?Immunization History  ?Administered Date(s) Administered  ? DTaP 02/25/2001, 04/27/2001, 05/30/2001, 01/10/2002, 11/11/2004  ? HPV 9-valent 07/11/2015, 08/21/2017  ? HPV Quadrivalent 07/06/2014  ? Hepatitis A 01/02/2011, 06/08/2012  ? Hepatitis B 2001-08-14, 11/30/2000, 04/27/2001  ? HiB (PRP-OMP) 12/28/2000, 02/25/2001, 04/27/2001, 01/10/2002  ? IPV 12/28/2000, 02/25/2001, 02/10/2003, 11/11/2004  ? Influenza Nasal 04/29/2007, 06/26/2008, 07/16/2009, 06/18/2010, 05/28/2011, 06/08/2012  ? Influenza,inj,Quad PF,6+ Mos 06/18/2017, 06/24/2018, 05/25/2019, 05/25/2020  ? Influenza-Unspecified 05/30/2013, 07/06/2014,  06/07/2015, 07/14/2016  ? MMR 11/29/2001, 11/11/2004  ? Meningococcal B, OMV 06/18/2017, 08/21/2017  ? Meningococcal Mcv4o 06/08/2012, 06/18/2017  ? PFIZER(Purple Top)SARS-COV-2 Vaccination 12/12/2019, 01/04/2020, 08/18/19

## 2021-12-09 ENCOUNTER — Ambulatory Visit
Admission: RE | Admit: 2021-12-09 | Discharge: 2021-12-09 | Disposition: A | Discharge status: Home / Self Care | Payer: 59 | Source: Ambulatory Visit | Attending: Physician Assistant | Admitting: Physician Assistant

## 2021-12-09 ENCOUNTER — Ambulatory Visit
Admission: RE | Admit: 2021-12-09 | Discharge: 2021-12-09 | Disposition: A | Discharge status: Home / Self Care | Payer: 59 | Attending: Physician Assistant | Admitting: Physician Assistant

## 2021-12-09 DIAGNOSIS — M25551 Pain in right hip: Secondary | ICD-10-CM | POA: Insufficient documentation

## 2021-12-10 ENCOUNTER — Other Ambulatory Visit: Payer: Self-pay | Admitting: Physician Assistant

## 2021-12-10 DIAGNOSIS — M25551 Pain in right hip: Secondary | ICD-10-CM

## 2021-12-16 ENCOUNTER — Telehealth: Payer: Self-pay

## 2021-12-16 NOTE — Telephone Encounter (Signed)
[facility] referring for for annual exam 4-6 weeks. Sch ws CNM or PA. Called and left voicemail for patient to call back to be scheduled.

## 2021-12-17 NOTE — Telephone Encounter (Signed)
Called and left voicemail for patient to call back to be scheduled. 

## 2021-12-17 NOTE — Telephone Encounter (Signed)
Patient is scheduled for 01/16/22 with [facility]

## 2022-01-01 ENCOUNTER — Encounter: Payer: Self-pay | Admitting: Physical Therapy

## 2022-01-01 ENCOUNTER — Ambulatory Visit: Payer: 59 | Attending: Orthopedic Surgery | Admitting: Physical Therapy

## 2022-01-01 DIAGNOSIS — M25551 Pain in right hip: Secondary | ICD-10-CM | POA: Diagnosis present

## 2022-01-04 NOTE — Therapy (Signed)
?OUTPATIENT PHYSICAL THERAPY LOWER EXTREMITY EVALUATION ? ? ?Patient Name: Kendra Morton ?MRN: [id number] ?DOB:Nov 09, 2000, 21 y.o., female ?Today's Date: 01/04/2022 ? ? PT End of Session - 01/04/22 1737   ? ? Visit Number 1   ? Number of Visits 9   ? Date for PT Re-Evaluation 01/29/22   ? PT Start Time 1424   ? PT Stop Time [id number]   ? PT Time Calculation (min) 55 min   ? Activity Tolerance Patient tolerated treatment well;Patient limited by pain   ? Behavior During Therapy [facility] for tasks assessed/performed   ? ?  ?  ? ?  ? ? ?Past Medical History:  ?Diagnosis Date  ? Allergy   ? Asthma   ? Heart murmur   ? Only when she was younger  ? Onychia and paronychia   ? right toe  ? ?Past Surgical History:  ?Procedure Laterality Date  ? WEIL OSTEOTOMY Left 03/30/2020  ? Procedure: WEIL OSTEOTOMY/TAILOR'S BUNION LEFT;  Surgeon: Samara Deist, DPM;  Location: [facility] OR[facility];  Service: Podiatry;  Laterality: Left;  ? WISDOM TOOTH EXTRACTION    ? ?Patient Active Problem List  ? Diagnosis Date Noted  ? Right hip pain 12/05/2021  ? Anxiety 12/05/2021  ? White coat syndrome with high blood pressure but without hypertension 08/21/2017  ? Dysmenorrhea in adolescent 08/21/2017  ? Mild intermittent asthma without complication [id number]  ? History of heart murmur in childhood 06/18/2017  ? Environmental allergies 06/18/2017  ? ? ?PCP: [name], PA-C ? ?REFERRING PROVIDER: Thornton Park, MD ? ?REFERRING DIAG: Snapping Right Hip ? ?THERAPY DIAG:  ?Right hip pain ? ?ONSET DATE: 10/30/21 ? ?SUBJECTIVE:  ? ?SUBJECTIVE STATEMENT: ?Pt. Reports no R hip pain currently at rest and 4/10 pinching pain in R hip with initial standing.  Pt. Currently takes antiinflammatory meds to manage pain. ? ?PERTINENT HISTORY: ?Pt. Student at [employer] and interested in going to PT school.  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   ? ?PAIN:  ?Are you having pain? Yes: NPRS scale: 4/10 ?Pain location: R hip ?Pain description: achy ?Aggravating factors: sit to  standing ?Relieving factors: rest/ meds ? ?PRECAUTIONS: None ? ?WEIGHT BEARING RESTRICTIONS No ? ?FALLS:  ?Has patient fallen in last 6 months? No ? ?LIVING ENVIRONMENT: ?Lives with: lives with their family ?Lives in: House/apartment ? ?OCCUPATION: student at [employer] ? ?PLOF: Independent ? ?PATIENT GOALS decrease R hip pain ? ? ?OBJECTIVE:  ? ?DIAGNOSTIC FINDINGS:  ? ?CLINICAL DATA: Chronic right hip pain ? ?EXAM: ?DG HIP (WITH OR WITHOUT PELVIS) 2-3V RIGHT ? ?COMPARISON: 06/21/2019 ? ?FINDINGS: ?There is no evidence of hip fracture or dislocation. There is no ?evidence of arthropathy or other focal bone abnormality. IUD device ?projects over the left pelvic midline. ? ?IMPRESSION: ?No acute abnormality. ? ? ?Electronically Signed ?By: Jerilynn Mages. Shick M.D. ?On: 12/09/2021 16:44  ? ?PATIENT SURVEYS:  ?FOTO initial 69/ goal 50 ? ?COGNITION: ? Overall cognitive status: Within functional limits for tasks assessed   ?  ?SENSATION: ?WFL ? ?MUSCLE LENGTH: ?Hamstrings: Right 90 deg; Left 90 deg (no issues) ?Thomas test: Negative ? ?POSTURE:  ?WNL ? ?PALPATION: ?(+) R hip tenderness at greater trochanter ? ?LE ROM: B hip AROM WNL (increase R hip pain/ pinching with R knee to chest/ piriformis stretch).   ? ?LE MMT: B LE strength grossly 5/5 MMT except B hip flexion 4+/5 MMT (increase R hip symptoms with resisted hip flexion)- pain resolves at rest ? ?LOWER EXTREMITY SPECIAL TESTS:  ?Hip special  tests: Saralyn Pilar [facility]) test: negative, Ober's test: positive , SI distraction test: negative, Hip scouring test: negative, and Piriformis test: positive  ? ?GAIT: ?Distance walked: community ?Assistive device utilized: None ?Level of assistance: Complete Independence ?Comments: normalized gait pattern noted.  ? ? ? ?TODAY'S TREATMENT: ?Discussed gym based ex. (Bulgarian squats) ? ? ?PATIENT EDUCATION:  ?Education details: Gym based ex ?Person educated: Patient ?Education method: Explanation ?Education comprehension: verbalized  understanding ? ? ?HOME EXERCISE PROGRAM: ?TBD (will issue next tx.) ? ?ASSESSMENT: ? ?CLINICAL IMPRESSION: ?Patient is a pleasant 21 y.o. female who was seen today for physical therapy evaluation and treatment for snapping right hip syndrome.  Pt. Presents with no R hip pain at rest and "pinching" symptoms with initial standing from chair.  Pt. Ambulates with normalized gait pattern.  No LLD.  Good R hip ROM/ capsular mobility.  Marked B hip flexor weakness noted with increase R hip pain with resisted MMT.  No signs of labral involvement.  Pt. Will benefit from short-term skilled PT services to increase R hip ROM/ strengthening to improve pain-free mobility.   ? ? ?OBJECTIVE IMPAIRMENTS decreased activity tolerance, decreased ROM, decreased strength, impaired flexibility, and pain.  ? ?ACTIVITY LIMITATIONS occupation and school.  ? ?PERSONAL FACTORS Age and Fitness are also affecting patient's functional outcome.  ? ? ?REHAB POTENTIAL: Excellent ? ?CLINICAL DECISION MAKING: Stable/uncomplicated ? ?EVALUATION COMPLEXITY: Low ? ? ?GOALS: ?Goals reviewed with patient? Yes ? ?SHORT TERM GOALS: Target date: 01/15/22 ? ?Pt. Independent with HEP to increase R hip strength 1/2 muscle grade to improve pain-free mobility.  ?Baseline: B hip flexion strength 4+/5 (pain with MMT) ?Goal status: INITIAL ? ?LONG TERM GOALS: Target date: 01/29/22 ? ?Pt. Will increase FOTO to 83 to improve pain-free mobility. ?Baseline: 69 ?Goal status: INITIAL ? ?2.  Pt. Will report no R hip pain with gym based there.ex./ deep squats to improve pain-free mobility. ?Baseline: (+) R hip pain ?Goal status: INITIAL ? ?3.  Pt. Will report no R hip pain/snapping with sit to stands from chair.  ?Baseline: (+) pain reported with standing.  ?Goal status: INITIAL ? ? ? ?PLAN: ?PT FREQUENCY: 2x/week ? ?PT DURATION: 4 weeks ? ?PLANNED INTERVENTIONS: Therapeutic exercises, Therapeutic activity, Neuromuscular re-education, Balance training, Gait training,  Patient/Family education, Joint mobilization, Stair training, Dry Needling, Electrical stimulation, Cryotherapy, Moist heat, and Manual therapy ? ?PLAN FOR NEXT SESSION: Issue HEP/ recheck FABER and Ober test.   ? ?Pura Spice, PT, DPT # [phone] ? ?01/04/2022, 5:39 PM  ?

## 2022-01-08 ENCOUNTER — Ambulatory Visit: Payer: 59 | Admitting: Physical Therapy

## 2022-01-08 DIAGNOSIS — M25551 Pain in right hip: Secondary | ICD-10-CM

## 2022-01-08 NOTE — Patient Instructions (Signed)
Access Code: [email or web address] ?URL: [email or web address] ?Date: 01/08/2022 ?Prepared by: Dorene Grebe ? ?Exercises ?- Hooklying Clamshell with Resistance  - 1 x daily - 7 x weekly - 1 sets - 20 reps ?Maisie Fus Stretch on Table  - 1 x daily - 7 x weekly - 1 sets - 3 reps - 20 seconds hold ?

## 2022-01-11 NOTE — Therapy (Signed)
?OUTPATIENT PHYSICAL THERAPY LOWER EXTREMITY EVALUATION ? ? ?Patient Name: Kendra Morton ?MRN: [id number] ?DOB:June 10, 2001, 21 y.o., female ?Today's Date: 01/11/2022 ? ?Treatment: 2 of 9.  Recert date: 01/29/22 ?1343 to 1430 ? ? ?Past Medical History:  ?Diagnosis Date  ? Allergy   ? Asthma   ? Heart murmur   ? Only when she was younger  ? Onychia and paronychia   ? right toe  ? ?Past Surgical History:  ?Procedure Laterality Date  ? WEIL OSTEOTOMY Left 03/30/2020  ? Procedure: WEIL OSTEOTOMY/TAILOR'S BUNION LEFT;  Surgeon: [name], DPM;  Location: [facility] ORS;  Service: Podiatry;  Laterality: Left;  ? WISDOM TOOTH EXTRACTION    ? ?Patient Active Problem List  ? Diagnosis Date Noted  ? Right hip pain 12/05/2021  ? Anxiety 12/05/2021  ? White coat syndrome with high blood pressure but without hypertension 08/21/2017  ? Dysmenorrhea in adolescent 08/21/2017  ? Mild intermittent asthma without complication 06/18/2017  ? History of heart murmur in childhood 06/18/2017  ? Environmental allergies 06/18/2017  ? ? ?PCP: Alfredia Ferguson, PA-C ? ?REFERRING PROVIDER: Juanell Fairly, MD ? ?REFERRING DIAG: Snapping Right Hip ? ?THERAPY DIAG:  ?Right hip pain ? ?ONSET DATE: 10/30/21 ? ?SUBJECTIVE:  ? ?SUBJECTIVE STATEMENT: ?Pt. Reports no R hip pain prior to PT tx. Session. Pt. States she has R hip "popping" after playing tennis last night.  Pt. Will "pop" R hip at home to relieve symptoms.  Pt. Planning to play in mixed doubles tennis tournament this weekend.   ? ?PERTINENT HISTORY: ?Pt. Student at [employer] and interested in going to PT school.  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   ? ?PAIN:  ?Are you having pain? Yes: NPRS scale: 0-2/10 ?Pain location: R hip ?Pain description: achy ?Aggravating factors: sit to standing ?Relieving factors: rest/ meds ? ?PRECAUTIONS: None ? ?WEIGHT BEARING RESTRICTIONS No ? ?FALLS:  ?Has patient fallen in last 6 months? No ? ?LIVING ENVIRONMENT: ?Lives with: lives with their family ?Lives in:  House/apartment ? ?OCCUPATION: student at [employer] ? ?PLOF: Independent ? ?PATIENT GOALS decrease R hip pain ? ? ?OBJECTIVE:  ? ?DIAGNOSTIC FINDINGS:  ? ?CLINICAL DATA: Chronic right hip pain ? ?EXAM: ?DG HIP (WITH OR WITHOUT PELVIS) 2-3V RIGHT ? ?COMPARISON: 06/21/2019 ? ?FINDINGS: ?There is no evidence of hip fracture or dislocation. There is no ?evidence of arthropathy or other focal bone abnormality. IUD device ?projects over the left pelvic midline. ? ?IMPRESSION: ?No acute abnormality. ? ? ?Electronically Signed ?By: Judie Petit. Shick M.D. ?On: 12/09/2021 16:44  ? ?PATIENT SURVEYS:  ?FOTO initial 69/ goal 75 ? ?COGNITION: ? Overall cognitive status: Within functional limits for tasks assessed   ?  ?SENSATION: ?WFL ? ?MUSCLE LENGTH: ?Hamstrings: Right 90 deg; Left 90 deg (no issues) ?Thomas test: Negative ? ?POSTURE:  ?WNL ? ?PALPATION: ?(+) R hip tenderness at greater trochanter ? ?LE ROM: B hip AROM WNL (increase R hip pain/ pinching with R knee to chest/ piriformis stretch).   ? ?LE MMT: B LE strength grossly 5/5 MMT except B hip flexion 4+/5 MMT (increase R hip symptoms with resisted hip flexion)- pain resolves at rest ? ?LOWER EXTREMITY SPECIAL TESTS:  ?Hip special tests: Luisa Hart (FABER) test: negative, Ober's test: positive , SI distraction test: negative, Hip scouring test: negative, and Piriformis test: positive  ? ?GAIT: ?Distance walked: community ?Assistive device utilized: None ?Level of assistance: Complete Independence ?Comments: normalized gait pattern noted.  ? ? ? ?TODAY'S TREATMENT: ?There.ex.: ? ?Reviewed HEP ? ?Scifit L6  10 min. B UE/LE (warm-up/ discussed gym based/ HEP) ? ?Resisted gait at N[place]: 110# all 4-planes (cuing for proper step pattern)- good control ? ?Manual tx.: ? ?Supine L/R LE and lumbar stretches (all planes)- focus on R hip flexor/ hip adductor stretching.  3x each with satic holds.   ? ?(-) Obers test.  (-) Thomas test (good stretches, esp. To rectus).   ? ? ?PATIENT  EDUCATION:  ?Education details: Gym based ex/ Access Code: [email or web address] ?Person educated: Patient ?Education method: Explanation ?Education comprehension: verbalized understanding ? ? ?HOME EXERCISE PROGRAM: ?Access Code: [email or web address] ?URL: [email or web address] ?Date: 01/08/2022 ?Prepared by: Dorene Grebe ?  ?Exercises ?- Hooklying Clamshell with Resistance  - 1 x daily - 7 x weekly - 1 sets - 20 reps ?Maisie Fus Stretch on Table  - 1 x daily - 7 x weekly - 1 sets - 3 reps - 20 seconds hold ? ?ASSESSMENT: ? ?CLINICAL IMPRESSION: ?PT tx. Session focused on R hip stretches with focus on hip flexor/ adductor stretches.  No "popping" noted during tx. Session but weakness noted in R hip as compared to L.  Pt. Instructed in R hip/LE strengthening ex./ gym based program. Pt. Instructed to avoid painful movement patterns.  Pt. Will benefit from short-term skilled PT services to increase R hip ROM/ strengthening to improve pain-free mobility.   ? ? ?OBJECTIVE IMPAIRMENTS decreased activity tolerance, decreased ROM, decreased strength, impaired flexibility, and pain.  ? ?ACTIVITY LIMITATIONS occupation and school.  ? ?PERSONAL FACTORS Age and Fitness are also affecting patient's functional outcome.  ? ? ?REHAB POTENTIAL: Excellent ? ?CLINICAL DECISION MAKING: Stable/uncomplicated ? ?EVALUATION COMPLEXITY: Low ? ? ?GOALS: ?Goals reviewed with patient? Yes ? ?SHORT TERM GOALS: Target date: 01/15/22 ? ?Pt. Independent with HEP to increase R hip strength 1/2 muscle grade to improve pain-free mobility.  ?Baseline: B hip flexion strength 4+/5 (pain with MMT) ?Goal status: INITIAL ? ?LONG TERM GOALS: Target date: 01/29/22 ? ?Pt. Will increase FOTO to 83 to improve pain-free mobility. ?Baseline: 69 ?Goal status: INITIAL ? ?2.  Pt. Will report no R hip pain with gym based there.ex./ deep squats to improve pain-free mobility. ?Baseline: (+) R hip pain ?Goal status: INITIAL ? ?3.  Pt. Will report no R hip pain/snapping with sit to  stands from chair.  ?Baseline: (+) pain reported with standing.  ?Goal status: INITIAL ? ? ? ?PLAN: ?PT FREQUENCY: 2x/week ? ?PT DURATION: 4 weeks ? ?PLANNED INTERVENTIONS: Therapeutic exercises, Therapeutic activity, Neuromuscular re-education, Balance training, Gait training, Patient/Family education, Joint mobilization, Stair training, Dry Needling, Electrical stimulation, Cryotherapy, Moist heat, and Manual therapy ? ?PLAN FOR NEXT SESSION: Issue HEP/ recheck FABER and Ober test.   ? ?Cammie Mcgee, PT, DPT # [id number] ? ?01/11/2022, 6:39 AM  ?

## 2022-01-13 ENCOUNTER — Encounter: Payer: Self-pay | Admitting: Physical Therapy

## 2022-01-15 ENCOUNTER — Ambulatory Visit: Payer: 59 | Admitting: Physical Therapy

## 2022-01-15 DIAGNOSIS — M25551 Pain in right hip: Secondary | ICD-10-CM | POA: Diagnosis not present

## 2022-01-16 ENCOUNTER — Ambulatory Visit: Payer: Self-pay | Admitting: Obstetrics

## 2022-01-16 NOTE — Therapy (Signed)
OUTPATIENT PHYSICAL THERAPY LOWER EXTREMITY TREATMENT   Patient Name: Kendra Morton MRN: [id number] DOB:[birth year], 21 y.o., female Today's Date: 01/16/2022   PT End of Session - 01/16/22 1041     Visit Number 3    Number of Visits 9    Date for PT Re-Evaluation 01/29/22    PT Start Time 1339    PT Stop Time 1428    PT Time Calculation (min) 49 min    Activity Tolerance Patient tolerated treatment well;Patient limited by pain    Behavior During Therapy [facility] for tasks assessed/performed             Past Medical History:  Diagnosis Date   Allergy    Asthma    Heart murmur    Only when she was younger   Onychia and paronychia    right toe   Past Surgical History:  Procedure Laterality Date   WEIL OSTEOTOMY Left 03/30/2020   Procedure: WEIL OSTEOTOMY/TAILOR'S BUNION LEFT;  Surgeon: Kendra Morton, DPM;  Location: [facility] ORS;  Service: Podiatry;  Laterality: Left;   WISDOM TOOTH EXTRACTION     Patient Active Problem List   Diagnosis Date Noted   Right hip pain 12/05/2021   Anxiety 12/05/2021   White coat syndrome with high blood pressure but without hypertension 08/21/2017   Dysmenorrhea in adolescent 08/21/2017   Mild intermittent asthma without complication 06/18/2017   History of heart murmur in childhood 06/18/2017   Environmental allergies 06/18/2017    PCP: Kendra Ferguson, PA-C  REFERRING PROVIDER: Juanell Fairly, MD  REFERRING DIAG: Snapping Right Hip  THERAPY DIAG:  Right hip pain  ONSET DATE: 10/30/21  SUBJECTIVE:   SUBJECTIVE STATEMENT: Pt. States she sprained L ankle while playing tennis last Thursday night.  Pt. States she was walking to get a ball and rolled/inverted L ankle.  Mild bruising/ swelling noted in L ankle prior to tx. Session.  Pt. Entered PT with use of walking boot.  No c/o R hip pain at this time but pt. Reports "popping" in R hip over past couple days.  Pt. Unable to play in tennis tournament this past weekend secondary to L  ankle.     PERTINENT HISTORY: Pt. Student at [employer] and interested in going to PT school.  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    PAIN:  Are you having pain? Yes: NPRS scale: 0-2/10 Pain location: R hip Pain description: achy Aggravating factors: sit to standing Relieving factors: rest/ meds  PRECAUTIONS: None  WEIGHT BEARING RESTRICTIONS No  FALLS:  Has patient fallen in last 6 months? No  LIVING ENVIRONMENT: Lives with: lives with their family Lives in: House/apartment  OCCUPATION: student at [employer]  PLOF: Independent  PATIENT GOALS decrease R hip pain   OBJECTIVE:   DIAGNOSTIC FINDINGS:   CLINICAL DATA: Chronic right hip pain  EXAM: DG HIP (WITH OR WITHOUT PELVIS) 2-3V RIGHT  COMPARISON: 06/21/2019  FINDINGS: There is no evidence of hip fracture or dislocation. There is no evidence of arthropathy or other focal bone abnormality. IUD device projects over the left pelvic midline.  IMPRESSION: No acute abnormality.   Electronically Signed By: Kendra Morton. Shick M.D. On: 12/09/2021 16:44   PATIENT SURVEYS:  FOTO initial 69/ goal 46  COGNITION:  Overall cognitive status: Within functional limits for tasks assessed     SENSATION: WFL  MUSCLE LENGTH: Hamstrings: Right 90 deg; Left 90 deg (no issues) Thomas test: Negative  POSTURE:  WNL  PALPATION: (+) R hip tenderness at greater  trochanter  LE ROM: B hip AROM WNL (increase R hip pain/ pinching with R knee to chest/ piriformis stretch).    LE MMT: B LE strength grossly 5/5 MMT except B hip flexion 4+/5 MMT (increase R hip symptoms with resisted hip flexion)- pain resolves at rest  LOWER EXTREMITY SPECIAL TESTS:  Hip special tests: Kendra Morton (FABER) test: negative, Ober's test: positive , SI distraction test: negative, Hip scouring test: negative, and Piriformis test: positive   GAIT: Distance walked: community Assistive device utilized: None Level of assistance: Complete Independence Comments:  normalized gait pattern noted.     TODAY'S TREATMENT:  Left ankle sprain (report from Kendra HaiJacob Darr, PA-C on 01/09/22)  Kendra Morton has a left ankle sprain. No obvious displaced fracture on x-ray small questionable irregularity of the lateral malleolus. We are going to place her into a tall boot. She may weight-bear as tolerated in this or use her crutches. She will take naproxen twice a day. She will take Tylenol. She may come out of the boot for gentle range of motion. She may ice if this makes it feel better. We will see her back in about 10 to 12 days for x-ray of the left ankle and examination. She was in agreement with the plan of care all questions answered   01/16/22:  There.ex.:  No Scifit today secondary to L ankle sprain (pt. Returns to MD on Monday to reassess)  L sidelying R hip abduction (knee ext./ clamshell)- 20x each (added moderate manual resistance with clamshell)  Supine SLR L/R 10x.    Assessment of L ankle/ tuning fork test (negative).  Manual tx.:  Supine L/R LE and lumbar stretches (all planes)- focus on R hip flexor/ hip adductor stretching.  7x each with satic holds.  R hamstring stretches 4x 30 sec. Each.        PATIENT EDUCATION:  Education details: Gym based ex/ Access Code: [email or web address] Person educated: Patient Education method: Explanation Education comprehension: verbalized understanding   HOME EXERCISE PROGRAM: Access Code: [email or web address] URL: [email or web address] Date: 01/08/2022 Prepared by: Kendra GrebeMichael Yailen Morton   Exercises - Hooklying Clamshell with Resistance  - 1 x daily - 7 x weekly - 1 sets - 20 reps - Thomas Stretch on Table  - 1 x daily - 7 x weekly - 1 sets - 3 reps - 20 seconds hold  ASSESSMENT:  CLINICAL IMPRESSION: Pt. Tx. Limited to mat table ex. Secondary to recent L ankle sprain.  Pt. Presents with swelling/ bruising over L lateral aspect of ankle.  Good R hip ROM/ strengthening with manual resistance and no c/o pain.  1 episode of R  hip cavitation during hip adductor stretches.  Pt. Will benefit from short-term skilled PT services to increase R hip ROM/ strengthening to improve pain-free mobility.     OBJECTIVE IMPAIRMENTS decreased activity tolerance, decreased ROM, decreased strength, impaired flexibility, and pain.   ACTIVITY LIMITATIONS occupation and school.   PERSONAL FACTORS Age and Fitness are also affecting patient's functional outcome.    REHAB POTENTIAL: Excellent  CLINICAL DECISION MAKING: Stable/uncomplicated  EVALUATION COMPLEXITY: Low   GOALS: Goals reviewed with patient? Yes  SHORT TERM GOALS: Target date: 01/15/22  Pt. Independent with HEP to increase R hip strength 1/2 muscle grade to improve pain-free mobility.  Baseline: B hip flexion strength 4+/5 (pain with MMT) Goal status: INITIAL  LONG TERM GOALS: Target date: 01/29/22  Pt. Will increase FOTO to 83 to improve pain-free mobility. Baseline: 69 Goal status: INITIAL  2.  Pt. Will report  no R hip pain with gym based there.ex./ deep squats to improve pain-free mobility. Baseline: (+) R hip pain Goal status: INITIAL  3.  Pt. Will report no R hip pain/snapping with sit to stands from chair.  Baseline: (+) pain reported with standing.  Goal status: INITIAL    PLAN: PT FREQUENCY: 2x/week  PT DURATION: 4 weeks  PLANNED INTERVENTIONS: Therapeutic exercises, Therapeutic activity, Neuromuscular re-education, Balance training, Gait training, Patient/Family education, Joint mobilization, Stair training, Dry Needling, Electrical stimulation, Cryotherapy, Moist heat, and Manual therapy  PLAN FOR NEXT SESSION: Progress HEP/ discuss MD f/u for L ankle  Cammie Mcgee, PT, DPT # [phone]  01/16/2022, 10:49 AM

## 2022-01-21 ENCOUNTER — Encounter: Payer: Self-pay | Admitting: Obstetrics

## 2022-01-21 ENCOUNTER — Other Ambulatory Visit (HOSPITAL_COMMUNITY)
Admission: RE | Admit: 2022-01-21 | Discharge: 2022-01-21 | Disposition: A | Discharge status: Home / Self Care | Payer: 59 | Source: Ambulatory Visit | Attending: Obstetrics | Admitting: Obstetrics

## 2022-01-21 ENCOUNTER — Ambulatory Visit (INDEPENDENT_AMBULATORY_CARE_PROVIDER_SITE_OTHER): Payer: 59 | Admitting: Obstetrics

## 2022-01-21 VITALS — BP 120/80 | Ht 65.0 in | Wt 150.0 lb

## 2022-01-21 DIAGNOSIS — Z113 Encounter for screening for infections with a predominantly sexual mode of transmission: Secondary | ICD-10-CM | POA: Insufficient documentation

## 2022-01-21 DIAGNOSIS — Z124 Encounter for screening for malignant neoplasm of cervix: Secondary | ICD-10-CM | POA: Diagnosis present

## 2022-01-21 DIAGNOSIS — Z01419 Encounter for gynecological examination (general) (routine) without abnormal findings: Secondary | ICD-10-CM | POA: Diagnosis not present

## 2022-01-21 NOTE — Progress Notes (Signed)
Gynecology Annual Exam  PCP: Alfredia Ferguson, PA-C  Chief Complaint:  Chief Complaint  Patient presents with   Annual Exam    History of Present Illness:  [name] is a 21 y.o. No obstetric history on file. who LMP was No LMP recorded. (Menstrual status: IUD)., presents today for her annual examination. She is a Consulting civil engineer at [employer], and who would like to go to PT school. 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 Her menses are regular every 28-30 days, lasting 6 day(s).  Dysmenorrhea none. She does not have intermenstrual bleeding.  She is single partner, contraception - IUD.  Last Pap: has never had due to age.  Hx of STDs: none  There is no FH of breast cancer. There is no FH of ovarian cancer. The patient does not do self-breast exams.  Tobacco use: The patient denies current or previous tobacco use. Alcohol use: none Exercise: very active    The patient wears seatbelts: yes.   The patient reports that domestic violence in her life is absent.   Past Medical History:  Diagnosis Date   Allergy    Asthma    Heart murmur    Only when she was younger   Onychia and paronychia    right toe    Past Surgical History:  Procedure Laterality Date   WEIL OSTEOTOMY Left 03/30/2020   Procedure: WEIL OSTEOTOMY/TAILOR'S BUNION LEFT;  Surgeon: [name], DPM;  Location: [facility] ORS;  Service: Podiatry;  Laterality: Left;   WISDOM TOOTH EXTRACTION      Prior to Admission medications   Medication Sig Start Date End Date Taking? Authorizing Provider  levonorgestrel (MIRENA) 20 MCG/24HR IUD 1 each by Intrauterine route once.   Yes [provider]  sertraline (ZOLOFT) 50 MG tablet TAKE 1.5 TABLETS BY MOUTH AT BEDTIME. 10/02/21  Yes Maple Hudson., MD  albuterol (VENTOLIN HFA) 108 (90 Base) MCG/ACT inhaler Inhale 1-2 puffs into the lungs every 6 (six) hours as needed for wheezing or shortness of breath. Patient not taking: Reported on 01/21/2022  07/25/19   Margaretann Loveless, PA-C  clindamycin-benzoyl peroxide [facility]) gel Apply topically 2 (two) times daily. Patient not taking: Reported on 01/21/2022 06/26/21   Maple Hudson., MD  fluticasone [facility] HFA) 110 MCG/ACT inhaler Inhale 2 puffs into the lungs daily. Patient not taking: Reported on 01/21/2022 07/25/19   Margaretann Loveless, PA-C    Allergies  Allergen Reactions   Cephalexin Rash   Penicillins Rash    Gynecologic History: No LMP recorded. (Menstrual status: IUD). History of abnormal pap smear: No History of STI: No   Obstetric History: No obstetric history on file.  Social History   Socioeconomic History   Marital status: Single    Spouse name: Not on file   Number of children: Not on file   Years of education: 11   Highest education level: Not on file  Occupational History   Not on file  Tobacco Use   Smoking status: Never   Smokeless tobacco: Never  Vaping Use   Vaping Use: Never used  Substance and Sexual Activity   Alcohol use: No   Drug use: No   Sexual activity: Never  Other Topics Concern   Not on file  Social History Narrative   Not on file   Social Determinants of Health   Financial Resource Strain: Not on file  Food Insecurity: Not on file  Transportation Needs: Not on file  Physical Activity: Not  on file  Stress: Not on file  Social Connections: Not on file  Intimate Partner Violence: Not on file    Family History  Problem Relation Age of Onset   Autism Sister    Seizures Sister    ADD / ADHD Brother    Diabetes Maternal Grandfather    Hypertension Maternal Grandfather    Heart disease Paternal Grandfather        CHF,MI    Review of Systems  Constitutional: Negative.   HENT: Negative.    Eyes: Negative.   Cardiovascular: Negative.   Gastrointestinal: Negative.   Genitourinary: Negative.   Musculoskeletal: Negative.   Skin: Negative.   Neurological: Negative.   Endo/Heme/Allergies: Negative.    Psychiatric/Behavioral: Negative.      Physical Exam BP 120/80   Ht 5\' 5"  (1.651 m)   Wt 150 lb (68 kg)   BMI 24.96 kg/m    Physical Exam Constitutional:      Appearance: Normal appearance.  Genitourinary:     Vulva and rectum normal.     Genitourinary Comments: Shaves entire mons No rashes or lesions Bimanual: anteverted, midline, normal vaginal rugae, little to no discharge, no adnexal tenderness or enlargements.   HENT:     Head: Normocephalic and atraumatic.  Cardiovascular:     Rate and Rhythm: Normal rate and regular rhythm.     Pulses: Normal pulses.     Heart sounds: Normal heart sounds.  Pulmonary:     Effort: Pulmonary effort is normal.     Breath sounds: Normal breath sounds.  Abdominal:     General: Abdomen is flat.     Palpations: Abdomen is soft.  Musculoskeletal:        General: Normal range of motion.     Cervical back: Normal range of motion and neck supple.  Neurological:     General: No focal deficit present.     Mental Status: She is alert and oriented to person, place, and time.  Skin:    General: Skin is warm and dry.  Psychiatric:        Mood and Affect: Mood normal.        Behavior: Behavior normal.    Female chaperone present for pelvic and breast  portions of the physical exam  Results: AUDIT Questionnaire (screen for alcoholism): 0 PHQ-9: 0   Assessment: 21 y.o. No obstetric history on file. female here for routine annual gynecologic examination Fir4st dpap smear. IUD for birth control  Plan: Problem List Items Addressed This Visit   None   Screening: -- Blood pressure screen normal -- Weight screening: normal -- Depression screening negative (PHQ-9) -- Nutrition: normal -- cholesterol screening: not due for screening -- osteoporosis screening: not due -- tobacco screening: not using -- alcohol screening: AUDIT questionnaire indicates low-risk usage. -- family history of breast cancer screening: done. not at high  risk. -- no evidence of domestic violence or intimate partner violence. -- STD screening: gonorrhea/chlamydia NAAT collected -- pap smear collected per ASCCP guidelines -- flu vaccine  per PCP -- HPV vaccination series: received She will return next year for her next annual. Content with the IUD, and strings were visible today. STI screening for GC, CT, trich today.  36, CNM  01/21/2022 1:58 PM   01/21/2022 1:58 PM

## 2022-01-22 ENCOUNTER — Ambulatory Visit: Payer: 59 | Admitting: Physical Therapy

## 2022-01-22 DIAGNOSIS — M25551 Pain in right hip: Secondary | ICD-10-CM

## 2022-01-23 LAB — CERVICOVAGINAL ANCILLARY ONLY
Bacterial Vaginitis (gardnerella): NEGATIVE
Chlamydia: NEGATIVE
Comment: NEGATIVE
Comment: NEGATIVE
Comment: NEGATIVE
Comment: NORMAL
Neisseria Gonorrhea: NEGATIVE
Trichomonas: NEGATIVE

## 2022-01-23 LAB — CYTOLOGY - PAP: Diagnosis: NEGATIVE

## 2022-01-26 NOTE — Therapy (Signed)
OUTPATIENT PHYSICAL THERAPY LOWER EXTREMITY TREATMENT   Patient Name: Kendra Morton MRN: [id number] DOB:19-Oct-2000, 21 y.o., female Today's Date: 01/22/2022   PT End of Session - 01/26/22 2111     Visit Number 4    Number of Visits 9    Date for PT Re-Evaluation 01/29/22    PT Start Time 1338    PT Stop Time 1427    PT Time Calculation (min) 49 min    Activity Tolerance Patient tolerated treatment well;Patient limited by pain    Behavior During Therapy [facility] for tasks assessed/performed             Past Medical History:  Diagnosis Date   Allergy    Asthma    Heart murmur    Only when she was younger   Onychia and paronychia    right toe   Past Surgical History:  Procedure Laterality Date   WEIL OSTEOTOMY Left 03/30/2020   Procedure: WEIL OSTEOTOMY/TAILOR'S BUNION LEFT;  Surgeon: [name], DPM;  Location: [facility] ORS;  Service: Podiatry;  Laterality: Left;   WISDOM TOOTH EXTRACTION     Patient Active Problem List   Diagnosis Date Noted   Right hip pain 12/05/2021   Anxiety 12/05/2021   White coat syndrome with high blood pressure but without hypertension 08/21/2017   Dysmenorrhea in adolescent 08/21/2017   Mild intermittent asthma without complication 06/18/2017   History of heart murmur in childhood 06/18/2017   Environmental allergies 06/18/2017    PCP: Alfredia Ferguson, PA-C  REFERRING PROVIDER: Juanell Fairly, MD  REFERRING DIAG: Snapping Right Hip  THERAPY DIAG:  Right hip pain  ONSET DATE: 10/30/21  SUBJECTIVE:   SUBJECTIVE STATEMENT: Pt. Had f/u with MD and will continue to wear walking boot for next 10 days. Pt. Entered PT with use of walking boot.  No new complaints of R hip pain reported.  Pt. Has been less active since L ankle injury.  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      PERTINENT HISTORY: Pt. Student at [employer] and interested in going to PT school.  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    PAIN:  Are you  having pain? Yes: NPRS scale: 0-2/10 Pain location: R hip Pain description: achy Aggravating factors: sit to standing Relieving factors: rest/ meds  PRECAUTIONS: None  WEIGHT BEARING RESTRICTIONS No  FALLS:  Has patient fallen in last 6 months? No  LIVING ENVIRONMENT: Lives with: lives with their family Lives in: House/apartment  OCCUPATION: student at [employer]  PLOF: Independent  PATIENT GOALS decrease R hip pain   OBJECTIVE:   DIAGNOSTIC FINDINGS:   CLINICAL DATA: Chronic right hip pain  EXAM: DG HIP (WITH OR WITHOUT PELVIS) 2-3V RIGHT  COMPARISON: 06/21/2019  FINDINGS: There is no evidence of hip fracture or dislocation. There is no evidence of arthropathy or other focal bone abnormality. IUD device projects over the left pelvic midline.  IMPRESSION: No acute abnormality.   Electronically Signed By: Judie Petit. Shick M.D. On: 12/09/2021 16:44   PATIENT SURVEYS:  FOTO initial 69/ goal 74  COGNITION:  Overall cognitive status: Within functional limits for tasks assessed     SENSATION: WFL  MUSCLE LENGTH: Hamstrings: Right 90 deg; Left 90 deg (no issues) Thomas test: Negative  POSTURE:  WNL  PALPATION: (+) R hip tenderness at greater trochanter  LE ROM: B hip AROM WNL (increase R hip pain/ pinching with R knee to chest/ piriformis stretch).    LE MMT:  B LE strength grossly 5/5 MMT except B hip flexion 4+/5 MMT (increase R hip symptoms with resisted hip flexion)- pain resolves at rest  LOWER EXTREMITY SPECIAL TESTS:  Hip special tests: Luisa Hart (FABER) test: negative, Ober's test: positive , SI distraction test: negative, Hip scouring test: negative, and Piriformis test: positive   GAIT: Distance walked: community Assistive device utilized: None Level of assistance: Complete Independence Comments: normalized gait pattern noted.     TODAY'S TREATMENT:  01/22/22:  No ankle fracture.    There.ex.:  Supine SLR L/R 10x.  Bridging with  added hip abduction (BTB) 20x.  Marching with BTB while maintaining midline LE positioning.    Reviewed HEP.  No Scifit secondary boot.   Manual tx.:  Supine L/R LE and lumbar stretches (all planes)- focus on R hip flexor/ hip adductor stretching.  5x each with static holds.  R hamstring stretches 4x 30 sec. Each.    PT assessed L ankle AROM/ gentle isometrics in pain tolerable range.  Limited with IV/EV.  Marked improvement in L ankle bruising/ swelling as compared to last tx. Session.  Pt. Will continue to wear walking boot over next 10 days.       PATIENT EDUCATION:  Education details: Gym based ex/ Access Code: [email or web address] Person educated: Patient Education method: Explanation Education comprehension: verbalized understanding   HOME EXERCISE PROGRAM: Access Code: [email or web address] URL: [email or web address] Date: 01/08/2022 Prepared by: Dorene Grebe   Exercises - Hooklying Clamshell with Resistance  - 1 x daily - 7 x weekly - 1 sets - 20 reps - Thomas Stretch on Table  - 1 x daily - 7 x weekly - 1 sets - 3 reps - 20 seconds hold  ASSESSMENT:  CLINICAL IMPRESSION: Pt. Tx. Limited to mat table ex. Secondary to recent L ankle sprain.  Good R hip ROM/ strengthening with manual resistance and no c/o pain.  Tx. Focused on R hip stability in a pain tolerable range with BTB (good techinque).  Pt. Will benefit from short-term skilled PT services to increase R hip ROM/ strengthening to improve pain-free mobility.     OBJECTIVE IMPAIRMENTS decreased activity tolerance, decreased ROM, decreased strength, impaired flexibility, and pain.   ACTIVITY LIMITATIONS occupation and school.   PERSONAL FACTORS Age and Fitness are also affecting patient's functional outcome.    REHAB POTENTIAL: Excellent  CLINICAL DECISION MAKING: Stable/uncomplicated  EVALUATION COMPLEXITY: Low   GOALS: Goals reviewed with patient? Yes  SHORT TERM GOALS: Target date: 01/15/22  Pt. Independent  with HEP to increase R hip strength 1/2 muscle grade to improve pain-free mobility.  Baseline: B hip flexion strength 4+/5 (pain with MMT) Goal status: INITIAL  LONG TERM GOALS: Target date: 01/29/22  Pt. Will increase FOTO to 83 to improve pain-free mobility. Baseline: 69 Goal status: INITIAL  2.  Pt. Will report no R hip pain with gym based there.ex./ deep squats to improve pain-free mobility. Baseline: (+) R hip pain Goal status: INITIAL  3.  Pt. Will report no R hip pain/snapping with sit to stands from chair.  Baseline: (+) pain reported with standing.  Goal status: INITIAL    PLAN: PT FREQUENCY: 2x/week  PT DURATION: 4 weeks  PLANNED INTERVENTIONS: Therapeutic exercises, Therapeutic activity, Neuromuscular re-education, Balance training, Gait training, Patient/Family education, Joint mobilization, Stair training, Dry Needling, Electrical stimulation, Cryotherapy, Moist heat, and Manual therapy  PLAN FOR NEXT SESSION: Progress HEP  Cammie Mcgee, PT, DPT # [phone]  01/26/2022, 9:12 PM

## 2022-01-29 ENCOUNTER — Ambulatory Visit: Payer: 59 | Admitting: Physical Therapy

## 2022-01-29 ENCOUNTER — Encounter: Payer: Self-pay | Admitting: Physical Therapy

## 2022-01-29 DIAGNOSIS — M25551 Pain in right hip: Secondary | ICD-10-CM

## 2022-01-29 NOTE — Therapy (Addendum)
OUTPATIENT PHYSICAL THERAPY LOWER EXTREMITY TREATMENT   Patient Name: Kendra Morton MRN: [id number] DOB:2001/03/23, 21 y.o., female Today's Date: 01/29/2022   PT End of Session - 01/29/22 1340     Visit Number 5    Number of Visits 9    Date for PT Re-Evaluation 01/29/22    PT Start Time 1336    PT Stop Time 1430    PT Time Calculation (min) 54 min    Activity Tolerance Patient tolerated treatment well    Behavior During Therapy [facility] for tasks assessed/performed             Past Medical History:  Diagnosis Date   Allergy    Asthma    Heart murmur    Only when she was younger   Onychia and paronychia    right toe   Past Surgical History:  Procedure Laterality Date   WEIL OSTEOTOMY Left 03/30/2020   Procedure: WEIL OSTEOTOMY/TAILOR'S BUNION LEFT;  Surgeon: Samara Deist, DPM;  Location: [facility] ORS;  Service: Podiatry;  Laterality: Left;   WISDOM TOOTH EXTRACTION     Patient Active Problem List   Diagnosis Date Noted   Right hip pain 12/05/2021   Anxiety 12/05/2021   White coat syndrome with high blood pressure but without hypertension 08/21/2017   Dysmenorrhea in adolescent 08/21/2017   Mild intermittent asthma without complication 20/94/7096   History of heart murmur in childhood 06/18/2017   Environmental allergies 06/18/2017    PCP: [name], PA-C  REFERRING PROVIDER: Thornton Park, MD  REFERRING DIAG: Snapping Right Hip  THERAPY DIAG:  Right hip pain  ONSET DATE: 10/30/21  SUBJECTIVE:   SUBJECTIVE STATEMENT: Pt. Arrived to PT without use of walking boot today.  No new issues reported.  Pt. Had discomfort in R hip (pinching) while walking this morning.       PERTINENT HISTORY: Pt. Student at [employer] and interested in going to PT school.  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    PAIN:  Are you having pain? Yes: NPRS scale: 0-2/10 Pain location: R hip Pain description: achy Aggravating factors: sit to standing Relieving factors: rest/  meds  PRECAUTIONS: None  WEIGHT BEARING RESTRICTIONS No  FALLS:  Has patient fallen in last 6 months? No  LIVING ENVIRONMENT: Lives with: lives with their family Lives in: House/apartment  OCCUPATION: student at [employer]  PLOF: Independent  PATIENT GOALS decrease R hip pain   OBJECTIVE:   DIAGNOSTIC FINDINGS:   CLINICAL DATA: Chronic right hip pain  EXAM: DG HIP (WITH OR WITHOUT PELVIS) 2-3V RIGHT  COMPARISON: 06/21/2019  FINDINGS: There is no evidence of hip fracture or dislocation. There is no evidence of arthropathy or other focal bone abnormality. IUD device projects over the left pelvic midline.  IMPRESSION: No acute abnormality.   Electronically Signed By: Jerilynn Mages. Shick M.D. On: 12/09/2021 16:44   PATIENT SURVEYS:  FOTO initial 69/ goal 31  COGNITION:  Overall cognitive status: Within functional limits for tasks assessed     SENSATION: WFL  MUSCLE LENGTH: Hamstrings: Right 90 deg; Left 90 deg (no issues) Thomas test: Negative  POSTURE:  WNL  PALPATION: (+) R hip tenderness at greater trochanter  LE ROM: B hip AROM WNL (increase R hip pain/ pinching with R knee to chest/ piriformis stretch).    LE MMT: B LE strength grossly 5/5 MMT except B hip flexion 4+/5 MMT (increase R hip symptoms with resisted hip flexion)- pain resolves at rest  LOWER EXTREMITY SPECIAL TESTS:  Hip  special tests: Saralyn Pilar (FABER) test: negative, Ober's test: positive , SI distraction test: negative, Hip scouring test: negative, and Piriformis test: positive   GAIT: Distance walked: community Assistive device utilized: None Level of assistance: Complete Independence Comments: normalized gait pattern noted.     TODAY'S TREATMENT:  01/29/22:  There.ex.:  Nustep L4 10 min. B UE/LE (no increase L ankle pain).  Ambulating in clinic/ gym with normalized gait pattern with consistent heel strike/ toe off and step pattern.    Tandem gait (forward/ backwards)-  no UE assist needed.   Airex tandem gait with no UE assist.  Good L ankle control.    R LE partial lunges at Airex with added L heel raises 10x2.  Good technique/ L ankle stability (minimal fatigue noted).    Supine R hip AROM all planes 5x each.   Manual tx.:  Supine L/R LE and lumbar stretches (all planes)- focus on R hip flexor/ hip adductor/ abductor/ R rectus femoris stretching.  5x each with static holds.  R hamstring stretches 4x 30 sec. Each.    Supine L ankle manual stretches 5x  (limited L ankle IV secondary pain)- modified    01/22/22:  No ankle fracture.    There.ex.:  Supine SLR L/R 10x.  Bridging with added hip abduction (BTB) 20x.  Marching with BTB while maintaining midline LE positioning.    Reviewed HEP.  No Scifit secondary boot.   Manual tx.:  Supine L/R LE and lumbar stretches (all planes)- focus on R hip flexor/ hip adductor stretching.  5x each with static holds.  R hamstring stretches 4x 30 sec. Each.    PT assessed L ankle AROM/ gentle isometrics in pain tolerable range.  Limited with IV/EV.  Marked improvement in L ankle bruising/ swelling as compared to last tx. Session.  Pt. Will continue to wear walking boot over next 10 days.       PATIENT EDUCATION:  Education details: Gym based ex/ Access Code: [email or web address] Person educated: Patient Education method: Explanation Education comprehension: verbalized understanding   HOME EXERCISE PROGRAM: Access Code: [email or web address] URL: [email or web address] Date: 01/08/2022 Prepared by: Dorcas Carrow   Exercises - Hooklying Clamshell with Resistance  - 1 x daily - 7 x weekly - 1 sets - 20 reps - Thomas Stretch on Table  - 1 x daily - 7 x weekly - 1 sets - 3 reps - 20 seconds hold  ASSESSMENT:  CLINICAL IMPRESSION: Pt. Entered PT with no walking boot and ambulates in clinic with marked improvement in L ankle stability/ mobility.  Pain limited with L ankle IV/ DF stretches in supine.  PT introduced L  ankle stability ex. In //-bars with use of Airex. Good R hip AROM with no increase c/o pain.  Pt. Will benefit from short-term skilled PT services to increase R hip ROM/ strengthening to improve pain-free mobility.     OBJECTIVE IMPAIRMENTS decreased activity tolerance, decreased ROM, decreased strength, impaired flexibility, and pain.   ACTIVITY LIMITATIONS occupation and school.   PERSONAL FACTORS Age and Fitness are also affecting patient's functional outcome.    REHAB POTENTIAL: Excellent  CLINICAL DECISION MAKING: Stable/uncomplicated  EVALUATION COMPLEXITY: Low   GOALS: Goals reviewed with patient? Yes  SHORT TERM GOALS: Target date: 01/15/22  Pt. Independent with HEP to increase R hip strength 1/2 muscle grade to improve pain-free mobility.  Baseline: B hip flexion strength 4+/5 (pain with MMT) Goal status: Partially met  LONG TERM GOALS: Target date: 01/29/22  Pt. Will increase FOTO to  83 to improve pain-free mobility. Baseline: 69 Goal status: INITIAL  2.  Pt. Will report no R hip pain with gym based there.ex./ deep squats to improve pain-free mobility. Baseline: (+) R hip pain Goal status: INITIAL  3.  Pt. Will report no R hip pain/snapping with sit to stands from chair.  Baseline: (+) pain reported with standing.  Goal status: INITIAL    PLAN: PT FREQUENCY: 2x/week  PT DURATION: 4 weeks  PLANNED INTERVENTIONS: Therapeutic exercises, Therapeutic activity, Neuromuscular re-education, Balance training, Gait training, Patient/Family education, Joint mobilization, Stair training, Dry Needling, Electrical stimulation, Cryotherapy, Moist heat, and Manual therapy  PLAN FOR NEXT SESSION: CHECK GOALS/ RECERT next tx. Session.   Pura Spice, PT, DPT # [id number]  01/29/2022, 7:44 PM

## 2022-02-02 ENCOUNTER — Encounter: Payer: Self-pay | Admitting: Obstetrics

## 2022-02-05 ENCOUNTER — Ambulatory Visit: Payer: 59 | Attending: Orthopedic Surgery | Admitting: Physical Therapy

## 2022-02-05 DIAGNOSIS — M25551 Pain in right hip: Secondary | ICD-10-CM | POA: Diagnosis present

## 2022-02-05 NOTE — Therapy (Signed)
OUTPATIENT PHYSICAL THERAPY LOWER EXTREMITY TREATMENT   Patient Name: Kendra Morton MRN: [id number] DOB:16-Mar-2001, 21 y.o., female Today's Date: 02/05/2022  Treatment: 6 of 10.  Recert date: 12/04/60 5638 to 1432 (54 min).     Past Medical History:  Diagnosis Date   Allergy    Asthma    Heart murmur    Only when she was younger   Onychia and paronychia    right toe   Past Surgical History:  Procedure Laterality Date   WEIL OSTEOTOMY Left 03/30/2020   Procedure: WEIL OSTEOTOMY/TAILOR'S BUNION LEFT;  Surgeon: Samara Deist, DPM;  Location: [facility] ORS;  Service: Podiatry;  Laterality: Left;   WISDOM TOOTH EXTRACTION     Patient Active Problem List   Diagnosis Date Noted   Right hip pain 12/05/2021   Anxiety 12/05/2021   White coat syndrome with high blood pressure but without hypertension 08/21/2017   Dysmenorrhea in adolescent 08/21/2017   Mild intermittent asthma without complication 93/73/4287   History of heart murmur in childhood 06/18/2017   Environmental allergies 06/18/2017    PCP: [name], PA-C  REFERRING PROVIDER: Thornton Park, MD  REFERRING DIAG: Snapping Right Hip  THERAPY DIAG:  Right hip pain  ONSET DATE: 10/30/21  SUBJECTIVE:   SUBJECTIVE STATEMENT: Pt. Entered PT with more normalized gait pattern.  Pt. Continues to have R hip discomfort with certain movement patterns.         PERTINENT HISTORY: Pt. Student at [employer] and interested in going to PT school.  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    PAIN:  Are you having pain? Yes: NPRS scale: 0-2/10 Pain location: R hip Pain description: achy Aggravating factors: sit to standing Relieving factors: rest/ meds  PRECAUTIONS: None  WEIGHT BEARING RESTRICTIONS No  FALLS:  Has patient fallen in last 6 months? No  LIVING ENVIRONMENT: Lives with: lives with their family Lives in: House/apartment  OCCUPATION: student at [employer]  PLOF: Independent  PATIENT GOALS decrease R hip  pain   OBJECTIVE:   DIAGNOSTIC FINDINGS:   CLINICAL DATA: Chronic right hip pain  EXAM: DG HIP (WITH OR WITHOUT PELVIS) 2-3V RIGHT  COMPARISON: 06/21/2019  FINDINGS: There is no evidence of hip fracture or dislocation. There is no evidence of arthropathy or other focal bone abnormality. IUD device projects over the left pelvic midline.  IMPRESSION: No acute abnormality.   Electronically Signed By: Jerilynn Mages. Shick M.D. On: 12/09/2021 16:44   PATIENT SURVEYS:  FOTO initial 69/ goal 68  COGNITION:  Overall cognitive status: Within functional limits for tasks assessed     SENSATION: WFL  MUSCLE LENGTH: Hamstrings: Right 90 deg; Left 90 deg (no issues) Thomas test: Negative  POSTURE:  WNL  PALPATION: (+) R hip tenderness at greater trochanter  LE ROM: B hip AROM WNL (increase R hip pain/ pinching with R knee to chest/ piriformis stretch).    LE MMT: B LE strength grossly 5/5 MMT except B hip flexion 4+/5 MMT (increase R hip symptoms with resisted hip flexion)- pain resolves at rest  LOWER EXTREMITY SPECIAL TESTS:  Hip special tests: Saralyn Pilar (FABER) test: negative, Ober's test: positive , SI distraction test: negative, Hip scouring test: negative, and Piriformis test: positive   GAIT: Distance walked: community Assistive device utilized: None Level of assistance: Complete Independence Comments: normalized gait pattern noted.     TODAY'S TREATMENT:  02/05/22  Scifit L8 10 min. B LE (1.0 miles)- consistent cadence.    TG knee flexion: midline/ toe in/ toe out/  heel raises (15x each).  No R anterior hip pinching with TG during hip ER.  L ankle soreness with end-range PF  Reassessment of goals.    Manual tx.:  Supine R hip grade III AP mobs. 3x20 sec.  No increase pain.    Prone hip flexor stretches (manual) with static holds 5x each.    Supine L/R LE and lumbar stretches (all planes)- focus on R hip flexor/ hip adductor/ abductor/ R rectus femoris  stretching.  5x each with static holds.  R hamstring stretches 4x 30 sec. Each.     01/29/22:  There.ex.:  Nustep L4 10 min. B UE/LE (no increase L ankle pain).  Ambulating in clinic/ gym with normalized gait pattern with consistent heel strike/ toe off and step pattern.    Tandem gait (forward/ backwards)- no UE assist needed.   Airex tandem gait with no UE assist.  Good L ankle control.    R LE partial lunges at Airex with added L heel raises 10x2.  Good technique/ L ankle stability (minimal fatigue noted).    Supine R hip AROM all planes 5x each.   Manual tx.:  Supine L/R LE and lumbar stretches (all planes)- focus on R hip flexor/ hip adductor/ abductor/ R rectus femoris stretching.  5x each with static holds.  R hamstring stretches 4x 30 sec. Each.    Supine L ankle manual stretches 5x  (limited L ankle IV secondary pain)- modified    01/22/22:  No ankle fracture.    There.ex.:  Supine SLR L/R 10x.  Bridging with added hip abduction (BTB) 20x.  Marching with BTB while maintaining midline LE positioning.    Reviewed HEP.  No Scifit secondary boot.   Manual tx.:  Supine L/R LE and lumbar stretches (all planes)- focus on R hip flexor/ hip adductor stretching.  5x each with static holds.  R hamstring stretches 4x 30 sec. Each.    PT assessed L ankle AROM/ gentle isometrics in pain tolerable range.  Limited with IV/EV.  Marked improvement in L ankle bruising/ swelling as compared to last tx. Session.  Pt. Will continue to wear walking boot over next 10 days.       PATIENT EDUCATION:  Education details: Gym based ex/ Access Code: [email or web address] Person educated: Patient Education method: Explanation Education comprehension: verbalized understanding   HOME EXERCISE PROGRAM: Access Code: [email or web address] URL: [email or web address] Date: 01/08/2022 Prepared by: Dorcas Carrow   Exercises - Hooklying Clamshell with Resistance  - 1 x daily - 7 x weekly - 1 sets - 20  reps - Thomas Stretch on Table  - 1 x daily - 7 x weekly - 1 sets - 3 reps - 20 seconds hold  ASSESSMENT:  CLINICAL IMPRESSION: Pt. Ambulates around PT clinic with more normalized gait pattern and consistent heel strike/ toe off with no increase c/o ankle pain.  Pain does report increase L ankle discomfort with end-range PF.  Good R hip AROM with no increase c/o pain.  Pt. Has shown good progress towards goals and PT will decrease tx. Frequency to biweekly with focus on gym ex. Program.   Pt. Will benefit from short-term skilled PT services to increase R hip ROM/ strengthening to improve pain-free mobility.     OBJECTIVE IMPAIRMENTS decreased activity tolerance, decreased ROM, decreased strength, impaired flexibility, and pain.   ACTIVITY LIMITATIONS occupation and school.   PERSONAL FACTORS Age and Fitness are also affecting patient's functional outcome.    REHAB POTENTIAL: Excellent  CLINICAL DECISION MAKING:  Stable/uncomplicated  EVALUATION COMPLEXITY: Low   GOALS: Goals reviewed with patient? Yes  SHORT TERM GOALS: Target date: 03/05/22  Pt. Independent with HEP to increase R hip strength 1/2 muscle grade to improve pain-free mobility.  Baseline: B hip flexion strength 4+/5 (pain with MMT) Goal status: Partially met  LONG TERM GOALS: Target date: 04/02/22  Pt. Will increase FOTO to 83 to improve pain-free mobility. Baseline: 69.  6/7: 76 Goal status: Partially met  2.  Pt. Will report no R hip pain with gym based there.ex./ deep squats to improve pain-free mobility. Baseline: decrease c/o R hip pain Goal status: Partially met  3.  Pt. Will report no R hip pain/snapping with sit to stands from chair.  Baseline: (+) pain reported with standing.  Goal status: Partially met    PLAN: PT FREQUENCY: Biweekly  PT DURATION: 8 weeks  PLANNED INTERVENTIONS: Therapeutic exercises, Therapeutic activity, Neuromuscular re-education, Balance training, Gait training,  Patient/Family education, Joint mobilization, Stair training, Dry Needling, Electrical stimulation, Cryotherapy, Moist heat, and Manual therapy  PLAN FOR NEXT SESSION: Progress gym based there.ex.  Pura Spice, PT, DPT # [phone]  02/15/2022, 11:44 AM

## 2022-02-19 ENCOUNTER — Ambulatory Visit: Payer: 59 | Admitting: Physical Therapy

## 2022-02-19 DIAGNOSIS — M25551 Pain in right hip: Secondary | ICD-10-CM | POA: Diagnosis not present

## 2022-02-20 ENCOUNTER — Encounter: Payer: Self-pay | Admitting: Physical Therapy

## 2022-02-20 NOTE — Therapy (Addendum)
OUTPATIENT PHYSICAL THERAPY LOWER EXTREMITY TREATMENT   Patient Name: Kendra Morton MRN: [id number] DOB:[birth year], 21 y.o., female Today's Date: 02/20/2022   PT End of Session - 02/20/22 1146     Visit Number 7    Number of Visits 10    Date for PT Re-Evaluation 04/02/22    PT Start Time 9201    PT Stop Time 1506    PT Time Calculation (min) 49 min    Activity Tolerance Patient tolerated treatment well    Behavior During Therapy [facility] for tasks assessed/performed              Past Medical History:  Diagnosis Date   Allergy    Asthma    Heart murmur    Only when she was younger   Onychia and paronychia    right toe   Past Surgical History:  Procedure Laterality Date   WEIL OSTEOTOMY Left 03/30/2020   Procedure: WEIL OSTEOTOMY/TAILOR'S BUNION LEFT;  Surgeon: Samara Deist, DPM;  Location: [facility] ORS;  Service: Podiatry;  Laterality: Left;   WISDOM TOOTH EXTRACTION     Patient Active Problem List   Diagnosis Date Noted   Right hip pain 12/05/2021   Anxiety 12/05/2021   White coat syndrome with high blood pressure but without hypertension 08/21/2017   Dysmenorrhea in adolescent 08/21/2017   Mild intermittent asthma without complication 00/71/2197   History of heart murmur in childhood 06/18/2017   Environmental allergies 06/18/2017    PCP: [name], PA-C  REFERRING PROVIDER: Thornton Park, MD  REFERRING DIAG: Snapping Right Hip  THERAPY DIAG:  Right hip pain  ONSET DATE: 10/30/21  SUBJECTIVE:   SUBJECTIVE STATEMENT: Pt. Entered PT with more normalized gait pattern, and has been participating in PT observation hours throughout various PT clinics throughout [facility] over the past few weeks.  Pt. Continues to have R hip discomfort with certain movement patterns, and experiences regular popping and clicking.        PERTINENT HISTORY: Pt. Student at [employer] and interested in going to PT school.  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    PAIN:  Are you  having pain? Yes: NPRS scale: 0-2/10 Pain location: R hip Pain description: achy Aggravating factors: sit to standing Relieving factors: rest/ meds  PRECAUTIONS: None  WEIGHT BEARING RESTRICTIONS No  FALLS:  Has patient fallen in last 6 months? No  LIVING ENVIRONMENT: Lives with: lives with their family Lives in: House/apartment  OCCUPATION: student at [employer]  PLOF: Independent  PATIENT GOALS decrease R hip pain   OBJECTIVE:   DIAGNOSTIC FINDINGS:   CLINICAL DATA: Chronic right hip pain  EXAM: DG HIP (WITH OR WITHOUT PELVIS) 2-3V RIGHT  COMPARISON: 06/21/2019  FINDINGS: There is no evidence of hip fracture or dislocation. There is no evidence of arthropathy or other focal bone abnormality. IUD device projects over the left pelvic midline.  IMPRESSION: No acute abnormality.   Electronically Signed By: Jerilynn Mages. Shick M.D. On: 12/09/2021 16:44   PATIENT SURVEYS:  FOTO initial 69/ goal 12  COGNITION:  Overall cognitive status: Within functional limits for tasks assessed     SENSATION: WFL  MUSCLE LENGTH: Hamstrings: Right 90 deg; Left 90 deg (no issues) Thomas test: Negative  POSTURE:  WNL  PALPATION: (+) R hip tenderness at greater trochanter  LE ROM: B hip AROM WNL (increase R hip pain/ pinching with R knee to chest/ piriformis stretch).    LE MMT: B LE strength grossly 5/5 MMT except B hip flexion  4+/5 MMT (increase R hip symptoms with resisted hip flexion)- pain resolves at rest  LOWER EXTREMITY SPECIAL TESTS:  Hip special tests: Saralyn Pilar (FABER) test: negative, Ober's test: positive , SI distraction test: negative, Hip scouring test: negative, and Piriformis test: positive   GAIT: Distance walked: community Assistive device utilized: None Level of assistance: Complete Independence Comments: normalized gait pattern noted.     TODAY'S TREATMENT:  02/20/22: Scifit L8 10 min. B LE (1.0 miles)- consistent cadence.    There ex: TG  knee flexion: midline/ B single leg/ heel raises (15x each).   Lunging onto bosu ball - 10 x B Supine: SLR with 7.5 lb weight 10 x - 3 sets Supine: straight leg hip abduction with 7.5 lb weight 10 x - 2 sets  Manual therapy: Supine hamstring stretch - 30 s hold x 4 B Supine figure four stretch - 30 s hold x 4 B Supine PROM to L ankle - 3 mins all directions Supine long axis distraction of R hip - 30 s x 2    02/05/22  Scifit L8 10 min. B LE (1.0 miles)- consistent cadence.    TG knee flexion: midline/ toe in/ toe out/ heel raises (15x each).  No R anterior hip pinching with TG during hip ER.  L ankle soreness with end-range PF  Reassessment of goals.    Manual tx.:  Supine R hip grade III AP mobs. 3x20 sec.  No increase pain.    Prone hip flexor stretches (manual) with static holds 5x each.    Supine L/R LE and lumbar stretches (all planes)- focus on R hip flexor/ hip adductor/ abductor/ R rectus femoris stretching.  5x each with static holds.  R hamstring stretches 4x 30 sec. Each.        PATIENT EDUCATION:  Education details: Gym based ex/ Access Code: [email or web address] Person educated: Patient Education method: Explanation Education comprehension: verbalized understanding   HOME EXERCISE PROGRAM: Access Code: [email or web address] URL: [email or web address] Date: 01/08/2022 Prepared by: Dorcas Carrow   Exercises - Hooklying Clamshell with Resistance  - 1 x daily - 7 x weekly - 1 sets - 20 reps - Thomas Stretch on Table  - 1 x daily - 7 x weekly - 1 sets - 3 reps - 20 seconds hold  ASSESSMENT:  CLINICAL IMPRESSION: Pt. Ambulates around PT clinic with more normalized gait pattern and consistent heel strike/ toe off with no increase c/o ankle pain. Good R hip AROM, but pt experiences pain at end range external rotation B during PROM in supine poistion.  Pt. Has shown good progress towards goals and pt will decrease tx. Frequency to biweekly with focus on gym ex. Program.    Pt. Will benefit from short-term skilled PT services to increase R hip ROM/ strengthening to improve pain-free mobility and increase stability in her hip joint.    OBJECTIVE IMPAIRMENTS decreased activity tolerance, decreased ROM, decreased strength, impaired flexibility, and pain.   ACTIVITY LIMITATIONS occupation and school.   PERSONAL FACTORS Age and Fitness are also affecting patient's functional outcome.    REHAB POTENTIAL: Excellent  CLINICAL DECISION MAKING: Stable/uncomplicated  EVALUATION COMPLEXITY: Low   GOALS: Goals reviewed with patient? Yes  SHORT TERM GOALS: Target date: 03/05/22  Pt. Independent with HEP to increase R hip strength 1/2 muscle grade to improve pain-free mobility.  Baseline: B hip flexion strength 4+/5 (pain with MMT) Goal status: Partially met  LONG TERM GOALS: Target date: 04/02/22  Pt. Will increase FOTO to 83 to improve  pain-free mobility. Baseline: 69.  6/7: 76 Goal status: Partially met  2.  Pt. Will report no R hip pain with gym based there.ex./ deep squats to improve pain-free mobility. Baseline: decrease c/o R hip pain Goal status: Partially met  3.  Pt. Will report no R hip pain/snapping with sit to stands from chair.  Baseline: (+) pain reported with standing.  Goal status: Partially met    PLAN: PT FREQUENCY: Biweekly  PT DURATION: 8 weeks  PLANNED INTERVENTIONS: Therapeutic exercises, Therapeutic activity, Neuromuscular re-education, Balance training, Gait training, Patient/Family education, Joint mobilization, Stair training, Dry Needling, Electrical stimulation, Cryotherapy, Moist heat, and Manual therapy  PLAN FOR NEXT SESSION: Progress gym based there.ex.   Andee Lineman, SPT Pura Spice, PT, DPT # [phone]  02/20/2022, 12:53 PM

## 2022-03-05 ENCOUNTER — Encounter: Payer: 59 | Admitting: Physical Therapy

## 2022-03-10 ENCOUNTER — Ambulatory Visit (INDEPENDENT_AMBULATORY_CARE_PROVIDER_SITE_OTHER): Payer: 59 | Admitting: Family Medicine

## 2022-03-10 VITALS — BP 130/60 | HR 71 | Temp 98.6°F | Wt 159.0 lb

## 2022-03-10 DIAGNOSIS — Z113 Encounter for screening for infections with a predominantly sexual mode of transmission: Secondary | ICD-10-CM | POA: Diagnosis not present

## 2022-03-10 NOTE — Progress Notes (Unsigned)
Established patient visit   Patient: Kendra Morton   DOB: 31-Mar-2001   21 y.o. Female  MRN: [id number] Visit Date: 03/10/2022  Today's healthcare provider: Megan Mans, MD   No chief complaint on file.  Subjective    HPI  Patient is a 21 year old female who has been donating blood and plasma.  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  She states this has happened twice and they recommended she have autoimmune testing done. Patient has had 1 partner in her lifetime.  Neither is symptomatic neither has had any moderate stigmata.  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  Medications: Outpatient Medications Prior to Visit  Medication Sig   albuterol (VENTOLIN HFA) 108 (90 Base) MCG/ACT inhaler Inhale 1-2 puffs into the lungs every 6 (six) hours as needed for wheezing or shortness of breath.   clindamycin-benzoyl peroxide (BENZACLIN) gel Apply topically 2 (two) times daily.   fluticasone (FLOVENT HFA) 110 MCG/ACT inhaler Inhale 2 puffs into the lungs daily.   levonorgestrel (MIRENA) 20 MCG/24HR IUD 1 each by Intrauterine route once.   sertraline (ZOLOFT) 50 MG tablet TAKE 1.5 TABLETS BY MOUTH AT BEDTIME.   No facility-administered medications prior to visit.    Review of Systems  Constitutional:  Negative for fever.  Respiratory:  Negative for cough and shortness of breath.   Cardiovascular:  Negative for chest pain.  Musculoskeletal:  Positive for arthralgias.        Objective    BP 130/60 (BP Location: Right Arm, Patient Position: Sitting, Cuff Size: Normal)   Pulse 71   Temp 98.6 F (37 C) (Oral)   Wt 159 lb (72.1 kg)   SpO2 100%   BMI 26.46 kg/m  BP Readings from Last 3 Encounters:  03/10/22 130/60  01/21/22 120/80  12/05/21 126/77   Wt Readings from Last 3 Encounters:  03/10/22 159 lb (72.1 kg)  01/21/22 150 lb (68 kg)  12/05/21 159 lb 8 oz (72.3 kg)       Physical Exam Vitals reviewed.  Constitutional:      General: She is not in acute distress.    Appearance: She is well-developed.  HENT:     Head: Normocephalic and atraumatic.     Right Ear: Hearing normal.     Left Ear: Hearing normal.     Nose: Nose normal.  Eyes:     General: Lids are normal. No scleral icterus.       Right eye: No discharge.        Left eye: No discharge.     Conjunctiva/sclera: Conjunctivae normal.  Cardiovascular:     Rate and Rhythm: Normal rate and regular rhythm.     Heart sounds: Normal heart sounds.  Pulmonary:     Effort: Pulmonary effort is normal. No respiratory distress.  Skin:    Findings: No lesion or rash.  Neurological:     General: No focal deficit present.     Mental Status: She is alert and oriented to person, place, and time.  Psychiatric:        Mood and Affect: Mood normal.        Speech: Speech normal.        Behavior: Behavior normal.        Thought Content: Thought content normal.        Judgment: Judgment normal.       No results found for any  visits on 03/10/22.  Assessment & Plan     1. 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  Also check ANA as lupus is linked to this.  She has no symptoms of this.  Refer as appropriate to ID or rheumatology. - RPR w/reflex to TrepSure - ANA Direct w/Reflex if Positive   No follow-ups on file.      I, Megan Mans, MD, have reviewed all documentation for this visit. The documentation on 03/12/22 for the exam, diagnosis, procedures, and orders are all accurate and complete.    Jahmiya Guidotti Wendelyn Breslow, MD  [facility] [phone] (phone) [phone] (fax)  [facility]

## 2022-03-12 LAB — ANA W/REFLEX IF POSITIVE
Anti JO-1: <0.2 AI (ref 0.0–0.9)
Anti Nuclear Antibody (ANA): POSITIVE — AB
Centromere Ab Screen: <0.2 AI (ref 0.0–0.9)
Chromatin Ab SerPl-aCnc: 0.2 AI (ref 0.0–0.9)
ENA RNP Ab: 0.3 AI (ref 0.0–0.9)
ENA SM Ab Ser-aCnc: 0.2 AI (ref 0.0–0.9)
ENA SSA (RO) Ab: >8 AI — ABNORMAL HIGH (ref 0.0–0.9)
ENA SSB (LA) Ab: <0.2 AI (ref 0.0–0.9)
Scleroderma (Scl-70) (ENA) Antibody, IgG: <0.2 AI (ref 0.0–0.9)
dsDNA Ab: <1 IU/mL (ref 0–9)

## 2022-03-12 LAB — RPR W/REFLEX TO TREPSURE: RPR: REACTIVE — AB

## 2022-03-12 LAB — RPR, QUANT: RPR, Quant: 1:2 {titer} — ABNORMAL HIGH

## 2022-03-13 ENCOUNTER — Ambulatory Visit: Payer: 59 | Attending: Orthopedic Surgery

## 2022-03-13 DIAGNOSIS — M25551 Pain in right hip: Secondary | ICD-10-CM | POA: Insufficient documentation

## 2022-03-13 NOTE — Therapy (Addendum)
OUTPATIENT PHYSICAL THERAPY LOWER EXTREMITY TREATMENT   Patient Name: Kendra Morton MRN: [id number] DOB:[birth year], 21 y.o., female Today's Date: 03/13/2022   PT End of Session - 03/13/22 0739     Visit Number 8    Number of Visits 10    Date for PT Re-Evaluation 04/02/22    PT Start Time 0735    PT Stop Time 0818    PT Time Calculation (min) 43 min    Activity Tolerance Patient tolerated treatment well    Behavior During Therapy [facility] for tasks assessed/performed              Past Medical History:  Diagnosis Date   Allergy    Asthma    Heart murmur    Only when she was younger   Onychia and paronychia    right toe   Past Surgical History:  Procedure Laterality Date   WEIL OSTEOTOMY Left 03/30/2020   Procedure: WEIL OSTEOTOMY/TAILOR'S BUNION LEFT;  Surgeon: Samara Deist, DPM;  Location: [facility] ORS;  Service: Podiatry;  Laterality: Left;   WISDOM TOOTH EXTRACTION     Patient Active Problem List   Diagnosis Date Noted   Right hip pain 12/05/2021   Anxiety 12/05/2021   White coat syndrome with high blood pressure but without hypertension 08/21/2017   Dysmenorrhea in adolescent 08/21/2017   Mild intermittent asthma without complication 40/76/8088   History of heart murmur in childhood 06/18/2017   Environmental allergies 06/18/2017    PCP: Kendra Kirschner, PA-C  REFERRING PROVIDER: Thornton Park, MD  REFERRING DIAG: Snapping Right Hip  THERAPY DIAG:  Right hip pain  ONSET DATE: 10/30/21  SUBJECTIVE:   SUBJECTIVE STATEMENT: Pt. Entered PT with more normalized gait pattern, but stated that she rolled her ankle last night at tennis practice. 0/10 pain in hips and ankle, and pt had no swelling noted in L ankle. Pt. Continues to have bilateral hip clicking with certain movement patterns, and experiences regular pressure within the joint.        PERTINENT HISTORY: Pt. Student at [employer] and interested in going to PT school.  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     PAIN:  Are you having pain? Yes: NPRS scale: 0/10 Pain location: R hip Pain description: achy Aggravating factors: sit to standing Relieving factors: rest/ meds  PRECAUTIONS: None  WEIGHT BEARING RESTRICTIONS No  FALLS:  Has patient fallen in last 6 months? No  LIVING ENVIRONMENT: Lives with: lives with their family Lives in: House/apartment  OCCUPATION: student at [employer]  PLOF: Independent  PATIENT GOALS decrease R hip pain   OBJECTIVE:   DIAGNOSTIC FINDINGS:   CLINICAL DATA: Chronic right hip pain  EXAM: DG HIP (WITH OR WITHOUT PELVIS) 2-3V RIGHT  COMPARISON: 06/21/2019  FINDINGS: There is no evidence of hip fracture or dislocation. There is no evidence of arthropathy or other focal bone abnormality. IUD device projects over the left pelvic midline.  IMPRESSION: No acute abnormality.   Electronically Signed By: Jerilynn Mages. Shick M.D. On: 12/09/2021 16:44   PATIENT SURVEYS:  FOTO initial 69/ goal 68  COGNITION:  Overall cognitive status: Within functional limits for tasks assessed     SENSATION: WFL  MUSCLE LENGTH: Hamstrings: Right 90 deg; Left 90 deg (no issues) Thomas test: Negative  POSTURE:  WNL  PALPATION: (+) R hip tenderness at greater trochanter  LE ROM: B hip AROM WNL (increase R hip pain/ pinching with R knee to chest/ piriformis stretch).    LE MMT: B LE strength  grossly 5/5 MMT except B hip flexion 4+/5 MMT (increase R hip symptoms with resisted hip flexion)- pain resolves at rest  LOWER EXTREMITY SPECIAL TESTS:  Hip special tests: Saralyn Pilar (FABER) test: negative, Ober's test: positive , SI distraction test: negative, Hip scouring test: negative, and Piriformis test: positive   GAIT: Distance walked: community Assistive device utilized: None Level of assistance: Complete Independence Comments: normalized gait pattern noted.     TODAY'S TREATMENT:  03/12/22: SciFit - L 6 10 mins B LE   Manual Therapy: Supine  stretching into hip flexion / ER / IR  / hamstrings - 30 s hold at end range B Supine grade 3 oscillating long axis hip distraction 25sec x per LE Supine grade 3 hip distraction with flexed hip to 90 degrees 20sec x B Supine long axis hip distraction with 30 s hold her LE   There ex: Suping Bridging on clear ball x 10 Supine Single leg bridge on blue ball x 10 per LE Supine SLR with 4# weight x20/LE Side lying SLR 4# 20 x per LE  Standing hip abduction with slider under foot - 20 x per LE next to // bars Forward lunge onto bosu ball in // bars - 10 x per LE Lateral lunge onto bosu ball in // bars - 15 x per LE  Inverted bosu ball squats 10 x 2 sets with light UE support on // bar. Noted heavy use of ankle strategy to stabilize  PATIENT EDUCATION:  Education details: Gym based ex/ Access Code: [email or web address] Person educated: Patient Education method: Explanation Education comprehension: verbalized understanding   HOME EXERCISE PROGRAM: Access Code: [email or web address] URL: [email or web address] Date: 01/08/2022 Prepared by: Dorcas Carrow   Exercises - Hooklying Clamshell with Resistance  - 1 x daily - 7 x weekly - 1 sets - 20 reps - Thomas Stretch on Table  - 1 x daily - 7 x weekly - 1 sets - 3 reps - 20 seconds hold  ASSESSMENT:  CLINICAL IMPRESSION: Pt ambulates with a reciprocal gait pattern, without any indication of trendelenburg/glute weakness nor weakness in L ankle mortise. Pt performed interventions in Croc shoes, and she required cueing on maintaining ankle stability and performing interventions slow and controlled. Pt tolerated inverted bosu ball squats well, but was challenged by hip strength and ankle stability. Pt also also challenged by single leg bridging, and had muscular fasciculations in her quadriceps throughout intervention. Pt will benefit from further skilled PT to emphasize stability in hip joints, strengthen B hips, and emphasize L ankle stability.     OBJECTIVE IMPAIRMENTS decreased activity tolerance, decreased ROM, decreased strength, impaired flexibility, and pain.   ACTIVITY LIMITATIONS occupation and school.   PERSONAL FACTORS Age and Fitness are also affecting patient's functional outcome.    REHAB POTENTIAL: Excellent  CLINICAL DECISION MAKING: Stable/uncomplicated  EVALUATION COMPLEXITY: Low   GOALS: Goals reviewed with patient? Yes  SHORT TERM GOALS: Target date: 03/05/22  Pt. Independent with HEP to increase R hip strength 1/2 muscle grade to improve pain-free mobility.  Baseline: B hip flexion strength 4+/5 (pain with MMT) Goal status: Partially met  LONG TERM GOALS: Target date: 04/02/22  Pt. Will increase FOTO to 83 to improve pain-free mobility. Baseline: 69.  6/7: 76 Goal status: Partially met  2.  Pt. Will report no R hip pain with gym based there.ex./ deep squats to improve pain-free mobility. Baseline: decrease c/o R hip pain Goal status: Partially met  3.  Pt. Will report no R hip pain/snapping with  sit to stands from chair.  Baseline: (+) pain reported with standing.  Goal status: Partially met    PLAN: PT FREQUENCY: Biweekly  PT DURATION: 8 weeks  PLANNED INTERVENTIONS: Therapeutic exercises, Therapeutic activity, Neuromuscular re-education, Balance training, Gait training, Patient/Family education, Joint mobilization, Stair training, Dry Needling, Electrical stimulation, Cryotherapy, Moist heat, and Manual therapy  PLAN FOR NEXT SESSION: Progress gym based there.ex.   Salem Caster. Fairly IV, PT, DPT Physical Therapist- [place], [place] 03/13/2022, 8:48 AM

## 2022-03-14 ENCOUNTER — Other Ambulatory Visit: Payer: Self-pay | Admitting: *Deleted

## 2022-03-14 ENCOUNTER — Other Ambulatory Visit
Admission: RE | Admit: 2022-03-14 | Discharge: 2022-03-14 | Disposition: A | Discharge status: Home / Self Care | Payer: 59 | Attending: Family Medicine | Admitting: Family Medicine

## 2022-03-14 ENCOUNTER — Telehealth: Payer: Self-pay

## 2022-03-14 DIAGNOSIS — A539 Syphilis, unspecified: Secondary | ICD-10-CM

## 2022-03-14 DIAGNOSIS — Z113 Encounter for screening for infections with a predominantly sexual mode of transmission: Secondary | ICD-10-CM | POA: Diagnosis present

## 2022-03-14 NOTE — Telephone Encounter (Signed)
Copied from CRM [id number]. Topic: General - Inquiry >> Mar 14, 2022 11:40 AM Marlow Baars wrote: Reason for CRM: Patient called in stating she was returning [facility] The phone call concerning lab results. I do not see anything in the chart enabling [facility] to release results. Please assist patient further.

## 2022-03-14 NOTE — Telephone Encounter (Signed)
Already spoke to pt's mother Vernona Rieger.

## 2022-03-19 LAB — MISC LABCORP TEST (SEND OUT): Labcorp test code: 9985

## 2022-03-21 ENCOUNTER — Other Ambulatory Visit: Payer: Self-pay | Admitting: *Deleted

## 2022-03-21 ENCOUNTER — Telehealth: Payer: Self-pay | Admitting: Physician Assistant

## 2022-03-21 DIAGNOSIS — R768 Other specified abnormal immunological findings in serum: Secondary | ICD-10-CM

## 2022-03-21 NOTE — Telephone Encounter (Signed)
Called dept of health back, lab still pending they thought it would be back already;  They will call monday

## 2022-03-21 NOTE — Telephone Encounter (Signed)
Kendra Morton with [facility].  He is needing to know if a TPPA or RPR has been done.  He states this is an urgent matter.    [phone]

## 2022-03-21 NOTE — Telephone Encounter (Signed)
I have already called pt's mother and [facility]. Sample was sent to a different lab by [facility]. So we are still waiting for the last test result that was ordered.

## 2022-03-31 ENCOUNTER — Other Ambulatory Visit: Payer: Self-pay | Admitting: *Deleted

## 2022-03-31 ENCOUNTER — Telehealth: Payer: Self-pay

## 2022-03-31 DIAGNOSIS — R768 Other specified abnormal immunological findings in serum: Secondary | ICD-10-CM

## 2022-03-31 DIAGNOSIS — A539 Syphilis, unspecified: Secondary | ICD-10-CM

## 2022-03-31 NOTE — Telephone Encounter (Signed)
Called pt's mother. Advised the result apparently was never received by any of the labs. Reordered lab. Patient is coming by our office to have blood redrawn.

## 2022-03-31 NOTE — Telephone Encounter (Signed)
Called [facility] back to check on result statis. [facility] states they are faxing [place] results shortly.

## 2022-03-31 NOTE — Telephone Encounter (Signed)
Spoke to Cole [place] - no results at this time and will follow up with Cytology.  CMA will call him back at [phone] to let him know how much longer or what is going on.

## 2022-03-31 NOTE — Telephone Encounter (Signed)
Copied from CRM [id number]. Topic: General - Inquiry >> Mar 28, 2022 11:11 AM Haroldine Laws wrote: Reason for CRM: Denyse Amass with [facility] Dept called asking if we ever received any lab results for this patient.  Labs for STD.\\ [phone]

## 2022-04-01 NOTE — Telephone Encounter (Signed)
Denyse Amass called requesting to speak to the clinic regarding questions about this pt's labs please advise  [phone]

## 2022-04-02 LAB — T PALLIDUM SCREENING CASCADE: T pallidum Antibodies (TP-PA): NONREACTIVE

## 2022-04-02 NOTE — Telephone Encounter (Signed)
Kendra Morton was advised of results.

## 2022-05-07 ENCOUNTER — Encounter: Payer: Self-pay | Admitting: Family Medicine

## 2022-05-12 ENCOUNTER — Ambulatory Visit: Payer: 59 | Attending: Orthopedic Surgery | Admitting: Physical Therapy

## 2022-05-12 DIAGNOSIS — M25551 Pain in right hip: Secondary | ICD-10-CM | POA: Insufficient documentation

## 2022-05-19 ENCOUNTER — Ambulatory Visit: Payer: 59 | Admitting: Physical Therapy

## 2022-05-19 DIAGNOSIS — M25551 Pain in right hip: Secondary | ICD-10-CM

## 2022-05-24 NOTE — Therapy (Addendum)
Pt. Arrived to PT for screen of cervical spine/ TDN.  Pt. Presents with several trigger points in B UT (R worse than L) during palpation.  No radicular symptoms and good cervical AROM.  No charge for tx. Session.  Pt. May benefit from [facility] and regular stretching/ exercise program.    Pura Spice, PT, DPT # [phone]

## 2022-05-24 NOTE — Therapy (Signed)
Trigger Point Dry Needling (TDN)   Education performed with patient regarding potential benefit of TDN. Reviewed precautions and risks with patient. Reviewed special precautions/risks over lung fields which include pneumothorax. Reviewed signs and symptoms of pneumothorax and advised pt to go to ER immediately if these symptoms develop advise them of dry needling treatment. Pt provided verbal consent to treatment. TDN performed to bilateral UT musculature with 5, 0.25 x 40 L-type single needle placements on each side with 2 local twitch response (LTR). Pistoning technique utilized. Improved pain-free motion following intervention.   Good tx. Tolerance.    Pt. Instructed to contact PT if any questions.    Pura Spice, PT, DPT # [phone]

## 2022-05-26 ENCOUNTER — Ambulatory Visit: Payer: 59 | Admitting: Physical Therapy

## 2022-06-02 ENCOUNTER — Ambulatory Visit: Payer: 59 | Attending: Orthopedic Surgery | Admitting: Physical Therapy

## 2022-06-09 ENCOUNTER — Ambulatory Visit: Payer: 59 | Admitting: Physical Therapy

## 2022-06-16 ENCOUNTER — Encounter: Payer: 59 | Admitting: Physical Therapy

## 2022-06-23 ENCOUNTER — Encounter: Payer: 59 | Admitting: Physical Therapy

## 2022-07-06 ENCOUNTER — Encounter: Payer: Self-pay | Admitting: Emergency Medicine

## 2022-07-06 ENCOUNTER — Ambulatory Visit
Admission: EM | Admit: 2022-07-06 | Discharge: 2022-07-06 | Disposition: A | Discharge status: Home / Self Care | Payer: 59 | Attending: Emergency Medicine | Admitting: Emergency Medicine

## 2022-07-06 DIAGNOSIS — J069 Acute upper respiratory infection, unspecified: Secondary | ICD-10-CM | POA: Diagnosis not present

## 2022-07-06 MED ORDER — IPRATROPIUM BROMIDE 0.06 % NA SOLN: 2.0000 | Freq: Four times a day (QID) | NASAL | 12 refills | Status: DC

## 2022-07-06 MED ORDER — DOXYCYCLINE HYCLATE 100 MG PO CAPS: 100.0000 mg | ORAL_CAPSULE | Freq: Two times a day (BID) | ORAL | 0 refills | Status: DC

## 2022-07-06 MED ORDER — PROMETHAZINE-DM 6.25-15 MG/5ML PO SYRP: 5.0000 mL | ORAL_SOLUTION | Freq: Four times a day (QID) | ORAL | 0 refills | Status: DC | PRN

## 2022-07-06 MED ORDER — BENZONATATE 100 MG PO CAPS: 200.0000 mg | ORAL_CAPSULE | Freq: Three times a day (TID) | ORAL | 0 refills | Status: DC

## 2022-07-06 NOTE — ED Provider Notes (Signed)
[facility]    CSN: [id number] Arrival date & time: 07/06/22  0845      History   Chief Complaint Chief Complaint  Patient presents with   Sinus Problem    HPI Kendra Morton is a 21 y.o. female.   HPI  21 year old female here for evaluation Rester complaints.  Patient reports that for the last week she has been experiencing nasal congestion and sinus pressure with thick green nasal discharge, ear pain, sore throat, and a cough that is intermittently productive.  She states that it has become more productive over the last couple days and that this same green mucus that she is blowing out of her nose is what she is coughing up.  She has not had a fever and she denies any pain in her upper teeth, shortness breath, or wheezing.  Past Medical History:  Diagnosis Date   Allergy    Asthma    Heart murmur    Only when she was younger   Onychia and paronychia    right toe    Patient Active Problem List   Diagnosis Date Noted   Right hip pain 12/05/2021   Anxiety 12/05/2021   White coat syndrome with high blood pressure but without hypertension 08/21/2017   Dysmenorrhea in adolescent 08/21/2017   Mild intermittent asthma without complication 35/36/1443   History of heart murmur in childhood 06/18/2017   Environmental allergies 06/18/2017    Past Surgical History:  Procedure Laterality Date   WEIL OSTEOTOMY Left 03/30/2020   Procedure: WEIL OSTEOTOMY/TAILOR'S BUNION LEFT;  Surgeon: Samara Deist, DPM;  Location: [facility] ORS;  Service: Podiatry;  Laterality: Left;   WISDOM TOOTH EXTRACTION      OB History     Gravida  0   Para  0   Term  0   Preterm  0   AB  0   Living  0      SAB  0   IAB  0   Ectopic  0   Multiple  0   Live Births  0            Home Medications    Prior to Admission medications   Medication Sig Start Date End Date Taking? Authorizing Provider  benzonatate (TESSALON) 100 MG capsule Take 2 capsules (200 mg  total) by mouth every 8 (eight) hours. 07/06/22  Yes Margarette Canada, NP  doxycycline (VIBRAMYCIN) 100 MG capsule Take 1 capsule (100 mg total) by mouth 2 (two) times daily. 07/06/22  Yes Margarette Canada, NP  ipratropium (ATROVENT) 0.06 % nasal spray Place 2 sprays into both nostrils 4 (four) times daily. 07/06/22  Yes Margarette Canada, NP  levonorgestrel (MIRENA) 20 MCG/24HR IUD 1 each by Intrauterine route once.   Yes [provider]  promethazine-dextromethorphan (PROMETHAZINE-DM) 6.25-15 MG/5ML syrup Take 5 mLs by mouth 4 (four) times daily as needed. 07/06/22  Yes Margarette Canada, NP  sertraline (ZOLOFT) 50 MG tablet TAKE 1.5 TABLETS BY MOUTH AT BEDTIME. 10/02/21  Yes Jerrol Banana., MD  albuterol (VENTOLIN HFA) 108 (90 Base) MCG/ACT inhaler Inhale 1-2 puffs into the lungs every 6 (six) hours as needed for wheezing or shortness of breath. 07/25/19   Mar Daring, PA-C    Family History Family History  Problem Relation Age of Onset   Autism Sister    Seizures Sister    ADD / ADHD Brother    Diabetes Maternal Grandfather    Hypertension Maternal Grandfather    Heart  disease Paternal Grandfather        CHF,MI    Social History Social History   Tobacco Use   Smoking status: Never   Smokeless tobacco: Never  Vaping Use   Vaping Use: Never used  Substance Use Topics   Alcohol use: No   Drug use: No     Allergies   Cephalexin and Penicillins   Review of Systems Review of Systems  Constitutional:  Negative for fever.  HENT:  Positive for congestion, ear pain, rhinorrhea, sinus pressure, sinus pain and sore throat.   Respiratory:  Positive for cough. Negative for shortness of breath and wheezing.      Physical Exam Triage Vital Signs ED Triage Vitals  Enc Vitals Group     BP 07/06/22 0857 135/87     Pulse Rate 07/06/22 0857 88     Resp 07/06/22 0857 14     Temp 07/06/22 0857 97.9 F (36.6 C)     Temp Source 07/06/22 0857 Oral     SpO2 07/06/22 0857 98 %      Weight 07/06/22 0856 155 lb (70.3 kg)     Height 07/06/22 0856 _0  (1.651 m)     Head Circumference --      Peak Flow --      Pain Score 07/06/22 0856 3     Pain Loc --      Pain Edu? --      Excl. in [place]? --    No data found.  Updated Vital Signs BP 135/87 (BP Location: Right Arm)   Pulse 88   Temp 97.9 F (36.6 C) (Oral)   Resp 14   Ht _1  (1.651 m)   Wt 155 lb (70.3 kg)   SpO2 98%   BMI 25.79 kg/m   Visual Acuity Right Eye Distance:   Left Eye Distance:   Bilateral Distance:    Right Eye Near:   Left Eye Near:    Bilateral Near:     Physical Exam Vitals and nursing note reviewed.  Constitutional:      Appearance: Normal appearance. She is not ill-appearing.  HENT:     Head: Normocephalic and atraumatic.     Right Ear: Tympanic membrane, ear canal and external ear normal. There is no impacted cerumen.     Left Ear: Tympanic membrane, ear canal and external ear normal. There is no impacted cerumen.     Nose: Congestion and rhinorrhea present.     Comments: His mucosa is erythematous and edematous with thick yellow discharge in both nares.    Mouth/Throat:     Mouth: Mucous membranes are moist.     Pharynx: Oropharyngeal exudate and posterior oropharyngeal erythema present.     Comments: Posterior oropharynx is erythematous and injected with yellow postnasal drip. Cardiovascular:     Rate and Rhythm: Normal rate and regular rhythm.     Pulses: Normal pulses.     Heart sounds: Normal heart sounds. No murmur heard.    No friction rub. No gallop.  Pulmonary:     Effort: Pulmonary effort is normal.     Breath sounds: Normal breath sounds. No wheezing, rhonchi or rales.  Musculoskeletal:     Cervical back: Normal range of motion and neck supple.  Lymphadenopathy:     Cervical: No cervical adenopathy.  Skin:    General: Skin is warm and dry.     Capillary Refill: Capillary refill takes less than 2 seconds.     Findings: No erythema  or rash.  Neurological:      General: No focal deficit present.     Mental Status: She is alert and oriented to person, place, and time.  Psychiatric:        Mood and Affect: Mood normal.        Behavior: Behavior normal.        Thought Content: Thought content normal.        Judgment: Judgment normal.      UC Treatments / Results  Labs (all labs ordered are listed, but only abnormal results are displayed) Labs Reviewed - No data to display  EKG   Radiology No results found.  Procedures Procedures (including critical care time)  Medications Ordered in UC Medications - No data to display  Initial Impression / Assessment and Plan / UC Course  I have reviewed the triage vital signs and the nursing notes.  Pertinent labs & imaging results that were available during my care of the patient were reviewed by me and considered in my medical decision making (see chart for details).   Patient is a pleasant, nontoxic-appearing 21 year old female here for evaluation of respiratory symptoms as outlined in HPI above.  On exam she has mild erythema both tympanic membranes but they do not appear to be infected.  There is no injection and no effusion noted.  Her nasal mucosa is erythematous and edematous and there is purulent discharge in both nares as well as erythema and injection posterior oropharynx with purulent postnasal drip.  No sinus tenderness on exam.  Cardiopulmonary exam bisque lung sounds.  I suspect the patient has an upper respiratory infection and I will treat her with doxycycline twice daily for 10 days that she has an allergy to cephalosporins and penicillins.  I will also prescribe Atrovent nasal spray to help with her congestion and we discussed performing sinus irrigation to alleviate the mucus burden.  Tessalon Perles and Promethazine DM cough syrup as needed for cough and congestion.  Return precautions reviewed.   Final Clinical Impressions(s) / UC Diagnoses   Final diagnoses:  Acute upper  respiratory infection     Discharge Instructions      The Doxycycline twice daily with food for 10 days for treatment of your URI.  Perform sinus irrigation 2-3 times a day with a [employer]Med sinus rinse kit and distilled water.  Do not use tap water.  You can use plain over-the-counter Mucinex every 6 hours to break up the stickiness of the mucus so your body can clear it.  Increase your oral fluid intake to thin out your mucus so that is also able for your body to clear more easily.  Take an over-the-counter probiotic, such as Culturelle-align-activia, 1 hour after each dose of antibiotic to prevent diarrhea.  Use the Atrovent nasal spray, 2 squirts in each nostril every 6 hours, as needed for runny nose and postnasal drip.  Use the Tessalon Perles every 8 hours during the day.  Take them with a small sip of water.  They may give you some numbness to the base of your tongue or a metallic taste in your mouth, this is normal.  Use the Promethazine DM cough syrup at bedtime for cough and congestion.  It will make you drowsy so do not take it during the day.  If you develop any new or worsening symptoms return for reevaluation or see your primary care provider.      ED Prescriptions     Medication Sig Dispense Auth. Provider  benzonatate (TESSALON) 100 MG capsule Take 2 capsules (200 mg total) by mouth every 8 (eight) hours. 21 capsule Margarette Canada, NP   doxycycline (VIBRAMYCIN) 100 MG capsule Take 1 capsule (100 mg total) by mouth 2 (two) times daily. 20 capsule Margarette Canada, NP   ipratropium (ATROVENT) 0.06 % nasal spray Place 2 sprays into both nostrils 4 (four) times daily. 15 mL Margarette Canada, NP   promethazine-dextromethorphan (PROMETHAZINE-DM) 6.25-15 MG/5ML syrup Take 5 mLs by mouth 4 (four) times daily as needed. 118 mL Margarette Canada, NP      PDMP not reviewed this encounter.   Margarette Canada, NP 07/06/22 [phone]

## 2022-07-06 NOTE — Discharge Instructions (Signed)
The Doxycycline twice daily with food for 10 days for treatment of your URI.  Perform sinus irrigation 2-3 times a day with a [employer]Med sinus rinse kit and distilled water.  Do not use tap water.  You can use plain over-the-counter Mucinex every 6 hours to break up the stickiness of the mucus so your body can clear it.  Increase your oral fluid intake to thin out your mucus so that is also able for your body to clear more easily.  Take an over-the-counter probiotic, such as Culturelle-align-activia, 1 hour after each dose of antibiotic to prevent diarrhea.  Use the Atrovent nasal spray, 2 squirts in each nostril every 6 hours, as needed for runny nose and postnasal drip.  Use the Tessalon Perles every 8 hours during the day.  Take them with a small sip of water.  They may give you some numbness to the base of your tongue or a metallic taste in your mouth, this is normal.  Use the Promethazine DM cough syrup at bedtime for cough and congestion.  It will make you drowsy so do not take it during the day.  If you develop any new or worsening symptoms return for reevaluation or see your primary care provider.

## 2022-07-06 NOTE — ED Triage Notes (Signed)
Patient c/o sinus congestion and pressure  that started a week ago.  Patient denies fevers.

## 2022-07-16 ENCOUNTER — Encounter: Payer: Self-pay | Admitting: Emergency Medicine

## 2022-07-16 ENCOUNTER — Ambulatory Visit
Admission: EM | Admit: 2022-07-16 | Discharge: 2022-07-16 | Disposition: A | Discharge status: Home / Self Care | Payer: 59 | Attending: Family Medicine | Admitting: Family Medicine

## 2022-07-16 ENCOUNTER — Ambulatory Visit (INDEPENDENT_AMBULATORY_CARE_PROVIDER_SITE_OTHER): Payer: 59

## 2022-07-16 DIAGNOSIS — M79671 Pain in right foot: Secondary | ICD-10-CM | POA: Diagnosis not present

## 2022-07-16 DIAGNOSIS — M7671 Peroneal tendinitis, right leg: Secondary | ICD-10-CM

## 2022-07-16 MED ORDER — NAPROXEN 500 MG PO TABS: 500.0000 mg | ORAL_TABLET | Freq: Two times a day (BID) | ORAL | 0 refills | Status: DC

## 2022-07-16 NOTE — ED Provider Notes (Signed)
[facility]    CSN: [id number] Arrival date & time: 07/16/22  0930      History   Chief Complaint Chief Complaint  Patient presents with   Foot Pain    HPI  HPI Kendra Morton is a 21 y.o. female.   Kendra Morton presents for right foot pain for the past 2 days.  Notes yesterday it started hurting but this morning when she woke up she was not able to bear any weight on her leg.  She has been using crutches.  She has not taken anything for pain.  She did not hear any abnormal pops or sounds.  Denies any injury to the foot or ankle.  It is mostly on the outside of her foot.  She has Sjogren's. No other injuries, pain or concerns.     Past Medical History:  Diagnosis Date   Allergy    Asthma    Heart murmur    Only when she was younger   Onychia and paronychia    right toe    Patient Active Problem List   Diagnosis Date Noted   Right hip pain 12/05/2021   Anxiety 12/05/2021   White coat syndrome with high blood pressure but without hypertension 08/21/2017   Dysmenorrhea in adolescent 08/21/2017   Mild intermittent asthma without complication 06/18/2017   History of heart murmur in childhood 06/18/2017   Environmental allergies 06/18/2017    Past Surgical History:  Procedure Laterality Date   WEIL OSTEOTOMY Left 03/30/2020   Procedure: WEIL OSTEOTOMY/TAILOR'S BUNION LEFT;  Surgeon: [name], DPM;  Location: [facility];  Service: Podiatry;  Laterality: Left;   WISDOM TOOTH EXTRACTION      OB History     Gravida  0   Para  0   Term  0   Preterm  0   AB  0   Living  0      SAB  0   IAB  0   Ectopic  0   Multiple  0   Live Births  0            Home Medications    Prior to Admission medications   Medication Sig Start Date End Date Taking? Authorizing Provider  levonorgestrel (MIRENA) 20 MCG/24HR IUD 1 each by Intrauterine route once.   Yes [provider]  naproxen (NAPROSYN) 500 MG tablet Take 1 tablet (500 mg  total) by mouth 2 (two) times daily with a meal. 07/16/22  Yes Ryane Canavan, DO  sertraline (ZOLOFT) 50 MG tablet TAKE 1.5 TABLETS BY MOUTH AT BEDTIME. 10/02/21  Yes Maple Hudson., MD  albuterol (VENTOLIN HFA) 108 (90 Base) MCG/ACT inhaler Inhale 1-2 puffs into the lungs every 6 (six) hours as needed for wheezing or shortness of breath. 07/25/19   Margaretann Loveless, PA-C  benzonatate (TESSALON) 100 MG capsule Take 2 capsules (200 mg total) by mouth every 8 (eight) hours. 07/06/22   Becky Augusta, NP  doxycycline (VIBRAMYCIN) 100 MG capsule Take 1 capsule (100 mg total) by mouth 2 (two) times daily. 07/06/22   Becky Augusta, NP  ipratropium (ATROVENT) 0.06 % nasal spray Place 2 sprays into both nostrils 4 (four) times daily. 07/06/22   Becky Augusta, NP  promethazine-dextromethorphan (PROMETHAZINE-DM) 6.25-15 MG/5ML syrup Take 5 mLs by mouth 4 (four) times daily as needed. 07/06/22   Becky Augusta, NP    Family History Family History  Problem Relation Age of Onset   Autism Sister    Seizures  Sister    ADD / ADHD Brother    Diabetes Maternal Grandfather    Hypertension Maternal Grandfather    Heart disease Paternal Grandfather        CHF,MI    Social History Social History   Tobacco Use   Smoking status: Never   Smokeless tobacco: Never  Vaping Use   Vaping Use: Never used  Substance Use Topics   Alcohol use: No   Drug use: No     Allergies   Sulfamethoxazole-trimethoprim, Cephalexin, and Penicillins   Review of Systems Review of Systems: :negative unless otherwise stated in HPI.      Physical Exam Triage Vital Signs ED Triage Vitals  Enc Vitals Group     BP --      Pulse Rate 07/16/22 1007 74     Resp 07/16/22 1007 16     Temp 07/16/22 1007 98.6 F (37 C)     Temp Source 07/16/22 1007 Oral     SpO2 07/16/22 1007 99 %     Weight --      Height --      Head Circumference --      Peak Flow --      Pain Score 07/16/22 1005 7     Pain Loc --      Pain Edu?  --      Excl. in [place]? --    No data found.  Updated Vital Signs Pulse 74   Temp 98.6 F (37 C) (Oral)   Resp 16   SpO2 99%   Visual Acuity Right Eye Distance:   Left Eye Distance:   Bilateral Distance:    Right Eye Near:   Left Eye Near:    Bilateral Near:     Physical Exam GEN: well appearing female in no acute distress  CVS: well perfused  RESP: speaking in full sentences without pause, no respiratory distress  MSK: Right foot:  Ankle/Foot, Right: TTP noted at posterior to the base of the 5th metatarsal. No visible erythema, swelling, ecchymosis, or bony deformity. No  notable pes planus/cavus deformity. Transverse arch grossly  intact; No  evidence of tibiotalar  deviation; Range of motion is full  in all directions. Strength is 5/5  in all directions. No  tenderness at the insertion/body/myotendinous junction of the Achilles tendon; +peroneal tendon tenderness, No  tenderness on posterior aspects of lateral and medial malleolus; Stable  lateral and medial ligaments; Unremarkable  squeeze test; Talar dome non tender; Unremarkable  calcaneal squeeze; No  tenderness at the distal metatarsals;     UC Treatments / Results  Labs (all labs ordered are listed, but only abnormal results are displayed) Labs Reviewed - No data to display  EKG   Radiology DG Foot Complete Right  Result Date: 07/16/2022 CLINICAL DATA:  Right foot pain, difficulty bearing weight EXAM: RIGHT FOOT COMPLETE - 3+ VIEW COMPARISON:  05/11/2021 ankle radiographs FINDINGS: Stable small calcifications complete adjacent to the cuboid compatible with small multipartite os peroneus. No cortical discontinuity to indicate fracture. First digit sesamoids normal. No articular space narrowing. IMPRESSION: 1. No acute findings. Given the patient's difficulty bearing weight, MRI to assess for stress injury or other occult internal derangement may be warranted. 2. Small multipartite os peroneus. No cortical discontinuity  to indicate fracture. Electronically Signed   By: Gaylyn Rong M.D.   On: 07/16/2022 10:55    Procedures Procedures (including critical care time)  Medications Ordered in UC Medications - No data to display  Initial Impression / Assessment and Plan / UC Course  I have reviewed the triage vital signs and the nursing notes.  Pertinent labs & imaging results that were available during my care of the patient were reviewed by me and considered in my medical decision making (see chart for details).        Pt is a 21 y.o.  female with 2 days of insidious right foot pain. Obtained right foot films. Radiologist notes no acute findings but pt has small multipartite os peroneus without fracture. She has crutches already from previous injury.  She may need an MRI to rule out a stress fracture. After shared decision making will give her information to follow up with orthopedic provider if pain does not improve with conservative treatment.   Patient to gradually return to normal activities, as tolerated and continue ordinary activities within the limits permitted by pain. Prescribed Naproxen sodium  for pain relief.  Tylenol PRN. Advised patient to avoid other NSAIDs while taking Naprosyn. Counseled patient on red flag symptoms and when to seek immediate care.    Return and ED precautions given. Discussed MDM, treatment plan and plan for follow-up with patient/parent who agrees with plan.   Final Clinical Impressions(s) / UC Diagnoses   Final diagnoses:  Peroneal tendonitis, right  Right foot pain     Discharge Instructions      Your xray showed a small bone in the pertoneal tendon. This may cause some irritation in the tendon of your foot, called tendonitis.  Start by the pharmacy to pick up Naprosyn, a pain medication that helps with inflammation.  You can use your crutches to help with discomfort and pain.   You may need an MRI to look for a stress fracture. If your pain persists, will  follow-up with Dr. Joseph Berkshire.      ED Prescriptions     Medication Sig Dispense Auth. Provider   naproxen (NAPROSYN) 500 MG tablet Take 1 tablet (500 mg total) by mouth 2 (two) times daily with a meal. 30 tablet Kenniya Westrich, DO      PDMP not reviewed this encounter.   Katha Cabal, DO 07/16/22 1209

## 2022-07-16 NOTE — ED Triage Notes (Signed)
Pt presents with right foot pain since yesterday. Today patient can not bear weight on her foot. Pt denies any injury.

## 2022-07-16 NOTE — Discharge Instructions (Addendum)
Your xray showed a small bone in the pertoneal tendon. This may cause some irritation in the tendon of your foot, called tendonitis.  Start by the pharmacy to pick up Naprosyn, a pain medication that helps with inflammation.  You can use your crutches to help with discomfort and pain.   You may need an MRI to look for a stress fracture. If your pain persists, will follow-up with Dr. Joseph Berkshire.

## 2022-12-12 ENCOUNTER — Encounter: Payer: BC Managed Care – PPO | Admitting: Physician Assistant

## 2023-01-29 NOTE — Progress Notes (Signed)
I,Vanessa  Vital,acting as a Neurosurgeon for [employer], PA-C.,have documented all relevant documentation on the behalf of Alfredia Ferguson, PA-C,as directed by  Alfredia Ferguson, PA-C while in the presence of Alfredia Ferguson, PA-C.    Complete physical exam   Patient: Kendra Morton   DOB: 2000/09/17   22 y.o. Female  MRN: [id number] Visit Date: 01/30/2023  Today's healthcare provider: Alfredia Ferguson, PA-C   Chief Complaint  Patient presents with   Annual Exam   Medication Refill   Subjective    Kendra Morton is a 22 y.o. female who presents today for a complete physical exam.  She reports consuming a general diet. Gym/ health club routine includes yoga and plays tennis. She generally feels well. She reports sleeping well. She does not have additional problems to discuss today.  HPI  Discussed the use of AI scribe software for clinical note transcription with the patient, who gave verbal consent to proceed.  History of Present Illness   The patient presents for a routine physical. They report no new health concerns and deny any changes in their health status. She has a new dx of Sjogren's Syndrome and follows with rheumatology.  The patient is currently on Zoloft 75mg , which they report is effective and they request a refill. They deny any interest in seeking therapy at this time.  The patient denies any changes in their smoking or drinking habits and reports no new symptoms or health concerns. They are due for a gynecological visit soon and plan to have STD screening done at that time.       Past Medical History:  Diagnosis Date   Allergy    Asthma    Heart murmur    Only when she was younger   Onychia and paronychia    right toe   Sjogren's disease (HCC)    Past Surgical History:  Procedure Laterality Date   WEIL OSTEOTOMY Left 03/30/2020   Procedure: WEIL OSTEOTOMY/TAILOR'S BUNION LEFT;  Surgeon: [name], DPM;  Location: [facility] ORS;  Service: Podiatry;   Laterality: Left;   WISDOM TOOTH EXTRACTION     Social History   Socioeconomic History   Marital status: Single    Spouse name: Not on file   Number of children: Not on file   Years of education: 11   Highest education level: Not on file  Occupational History   Not on file  Tobacco Use   Smoking status: Never   Smokeless tobacco: Never  Vaping Use   Vaping Use: Never used  Substance and Sexual Activity   Alcohol use: No   Drug use: No   Sexual activity: Yes    Birth control/protection: I.U.D.  Other Topics Concern   Not on file  Social History Narrative   Not on file   Social Determinants of Health   Financial Resource Strain: Not on file  Food Insecurity: Not on file  Transportation Needs: Not on file  Physical Activity: Not on file  Stress: Not on file  Social Connections: Not on file  Intimate Partner Violence: Not on file   Family Status  Relation Name Status   Mother  Alive   Father  Alive   Sister  Alive   Brother  Alive   MGM  (Not Specified)       Reflux   MGF  (Not Specified)   PGM  Alive   PGF  Deceased   Family History  Problem Relation Age of Onset   Autism  Sister    Seizures Sister    ADD / ADHD Brother    Diabetes Maternal Grandfather    Hypertension Maternal Grandfather    Heart disease Paternal Grandfather        CHF,MI   Allergies  Allergen Reactions   Sulfamethoxazole-Trimethoprim Other (See Comments)    Headaches, etc...   Cephalexin Rash   Penicillins Rash    Patient Care Team: Alfredia Ferguson, PA-C as PCP - General (Physician Assistant)   Medications: Outpatient Medications Prior to Visit  Medication Sig   albuterol (VENTOLIN HFA) 108 (90 Base) MCG/ACT inhaler Inhale 1-2 puffs into the lungs every 6 (six) hours as needed for wheezing or shortness of breath.   levonorgestrel (MIRENA) 20 MCG/24HR IUD 1 each by Intrauterine route once.   [DISCONTINUED] sertraline (ZOLOFT) 50 MG tablet TAKE 1.5 TABLETS BY MOUTH AT BEDTIME.    [DISCONTINUED] benzonatate (TESSALON) 100 MG capsule Take 2 capsules (200 mg total) by mouth every 8 (eight) hours.   [DISCONTINUED] doxycycline (VIBRAMYCIN) 100 MG capsule Take 1 capsule (100 mg total) by mouth 2 (two) times daily.   [DISCONTINUED] ipratropium (ATROVENT) 0.06 % nasal spray Place 2 sprays into both nostrils 4 (four) times daily.   [DISCONTINUED] naproxen (NAPROSYN) 500 MG tablet Take 1 tablet (500 mg total) by mouth 2 (two) times daily with a meal.   [DISCONTINUED] promethazine-dextromethorphan (PROMETHAZINE-DM) 6.25-15 MG/5ML syrup Take 5 mLs by mouth 4 (four) times daily as needed.   No facility-administered medications prior to visit.    Review of Systems  Constitutional:  Positive for fatigue.  Musculoskeletal:  Positive for arthralgias.  All other systems reviewed and are negative.   Objective    BP 119/73 (BP Location: Right Arm, Patient Position: Sitting, Cuff Size: Normal)   Pulse 75   Temp 97.8 F (36.6 C) (Oral)   Resp 14   Ht 5\' 5"  (1.651 m)   Wt 148 lb 3.2 oz (67.2 kg)   LMP 01/30/2023 (Exact Date)   SpO2 100%   BMI 24.66 kg/m   Physical Exam Constitutional:      General: She is awake.     Appearance: She is well-developed. She is not ill-appearing.  HENT:     Head: Normocephalic.     Right Ear: Tympanic membrane normal.     Left Ear: Tympanic membrane normal.     Nose: Nose normal. No congestion or rhinorrhea.     Mouth/Throat:     Pharynx: No oropharyngeal exudate or posterior oropharyngeal erythema.  Eyes:     Conjunctiva/sclera: Conjunctivae normal.     Pupils: Pupils are equal, round, and reactive to light.  Neck:     Thyroid: No thyroid mass or thyromegaly.  Cardiovascular:     Rate and Rhythm: Normal rate and regular rhythm.     Heart sounds: Normal heart sounds.  Pulmonary:     Effort: Pulmonary effort is normal.     Breath sounds: Normal breath sounds.  Abdominal:     Palpations: Abdomen is soft.     Tenderness: There is no  abdominal tenderness.  Musculoskeletal:     Right lower leg: No swelling. No edema.     Left lower leg: No swelling. No edema.  Lymphadenopathy:     Cervical: No cervical adenopathy.  Skin:    General: Skin is warm.  Neurological:     Mental Status: She is alert and oriented to person, place, and time.  Psychiatric:        Attention and Perception: Attention  normal.        Mood and Affect: Mood normal.        Speech: Speech normal.        Behavior: Behavior normal. Behavior is cooperative.      Last depression screening scores    01/30/2023    1:43 PM 03/10/2022   10:34 AM 12/05/2021    2:25 PM  PHQ 2/9 Scores  PHQ - 2 Score 0 0 0  PHQ- 9 Score 2 0 0   Last fall risk screening    01/30/2023    1:43 PM  Fall Risk   Falls in the past year? 0  Number falls in past yr: 0  Injury with Fall? 0  Risk for fall due to : No Fall Risks  Follow up Falls evaluation completed   Last Audit-C alcohol use screening    01/30/2023    1:44 PM  Alcohol Use Disorder Test (AUDIT)  1. How often do you have a drink containing alcohol? 1  2. How many drinks containing alcohol do you have on a typical day when you are drinking? 0  3. How often do you have six or more drinks on one occasion? 0  AUDIT-C Score 1   A score of 3 or more in women, and 4 or more in men indicates increased risk for alcohol abuse, EXCEPT if all of the points are from question 1   No results found for any visits on 01/30/23.  Assessment & Plan    Routine Health Maintenance and Physical Exam  Exercise Activities and Dietary recommendations --balanced diet high in fiber and protein, low in sugars, carbs, fats. --physical activity/exercise 30 minutes 3-5 times a week     Immunization History  Administered Date(s) Administered   DTaP 02/25/2001, 04/27/2001, 05/30/2001, 01/10/2002, 11/11/2004   HIB (PRP-OMP) 12/28/2000, 02/25/2001, 04/27/2001, 01/10/2002   HPV 9-valent 07/11/2015, 08/21/2017   HPV Quadrivalent  07/06/2014   Hepatitis A 01/02/2011, 06/08/2012   Hepatitis B 01-12-2001, 11/30/2000, 04/27/2001   IPV 12/28/2000, 02/25/2001, 02/10/2003, 11/11/2004   Influenza Nasal 04/29/2007, 06/26/2008, 07/16/2009, 06/18/2010, 05/28/2011, 06/08/2012   Influenza,inj,Quad PF,6+ Mos 06/18/2017, 06/24/2018, 05/25/2019, 05/25/2020   Influenza-Unspecified 05/30/2013, 07/06/2014, 06/07/2015, 07/14/2016   MMR 11/29/2001, 11/11/2004   Meningococcal B, OMV 06/18/2017, 08/21/2017   Meningococcal Mcv4o 06/08/2012, 06/18/2017   PFIZER(Purple Top)SARS-COV-2 Vaccination 12/12/2019, 01/04/2020, 08/17/2020   Pneumococcal-Unspecified 12/28/2000, 02/25/2001, 08/06/2001   Tdap 01/02/2011, 10/26/2020   Varicella 11/29/2001, 11/28/2005    Health Maintenance  Topic Date Due   HIV Screening  Never done   Hepatitis C Screening  Never done   COVID-19 Vaccine (4 - 2023-24 season) 05/02/2022   INFLUENZA VACCINE  04/02/2023   PAP-Cervical Cytology Screening  01/21/2025   PAP SMEAR-Modifier  01/21/2025   DTaP/Tdap/Td (8 - Td or Tdap) 10/26/2030   HPV VACCINES  Completed    Discussed health benefits of physical activity, and encouraged her to engage in regular exercise appropriate for her age and condition.  Problem List Items Addressed This Visit       Other   GAD (generalized anxiety disorder)    Stable on zoloft 75 mg  No interest in therapy      Relevant Medications   sertraline (ZOLOFT) 50 MG tablet   Other Visit Diagnoses     Annual physical exam    -  Primary      Reviewed labs from rheum.  Upcoming visit w/ GYN, declines STI screening today.  Return in about 1 year (around 01/30/2024), or if  symptoms worsen or fail to improve, for CPE.     I, Alfredia Ferguson, PA-C have reviewed all documentation for this visit. The documentation on  01/30/23   for the exam, diagnosis, procedures, and orders are all accurate and complete. Alfredia Ferguson, PA-C [facility] [address]  [address] [place], [place], [address] Office: [phone] Fax: [phone]   [facility]

## 2023-01-30 ENCOUNTER — Ambulatory Visit (INDEPENDENT_AMBULATORY_CARE_PROVIDER_SITE_OTHER): Payer: BC Managed Care – PPO | Admitting: Physician Assistant

## 2023-01-30 ENCOUNTER — Encounter: Payer: Self-pay | Admitting: Physician Assistant

## 2023-01-30 VITALS — BP 119/73 | HR 75 | Temp 97.8°F | Resp 14 | Ht 65.0 in | Wt 148.2 lb

## 2023-01-30 DIAGNOSIS — Z0000 Encounter for general adult medical examination without abnormal findings: Secondary | ICD-10-CM

## 2023-01-30 DIAGNOSIS — F411 Generalized anxiety disorder: Secondary | ICD-10-CM | POA: Insufficient documentation

## 2023-01-30 MED ORDER — SERTRALINE HCL 50 MG PO TABS: ORAL_TABLET | ORAL | 3 refills | Status: AC

## 2023-01-30 NOTE — Assessment & Plan Note (Signed)
Stable on zoloft 75 mg  No interest in therapy

## 2023-06-09 ENCOUNTER — Encounter: Payer: Self-pay | Admitting: Emergency Medicine

## 2023-06-09 ENCOUNTER — Ambulatory Visit
Admission: EM | Admit: 2023-06-09 | Discharge: 2023-06-09 | Disposition: A | Discharge status: Home / Self Care | Payer: 59 | Attending: Physician Assistant | Admitting: Physician Assistant

## 2023-06-09 DIAGNOSIS — R051 Acute cough: Secondary | ICD-10-CM | POA: Diagnosis present

## 2023-06-09 DIAGNOSIS — J029 Acute pharyngitis, unspecified: Secondary | ICD-10-CM | POA: Insufficient documentation

## 2023-06-09 DIAGNOSIS — J069 Acute upper respiratory infection, unspecified: Secondary | ICD-10-CM | POA: Diagnosis not present

## 2023-06-09 DIAGNOSIS — Z1152 Encounter for screening for COVID-19: Secondary | ICD-10-CM | POA: Diagnosis not present

## 2023-06-09 LAB — RAPID INFLUENZA A&B ANTIGENS
Influenza A (ARMC): NEGATIVE
Influenza B (ARMC): NEGATIVE

## 2023-06-09 LAB — GROUP A STREP BY PCR: Group A Strep by PCR: NOT DETECTED

## 2023-06-09 LAB — SARS CORONAVIRUS 2 BY RT PCR: SARS Coronavirus 2 by RT PCR: NEGATIVE

## 2023-06-09 MED ORDER — IPRATROPIUM BROMIDE 0.06 % NA SOLN: 2.0000 | Freq: Four times a day (QID) | NASAL | 0 refills | Status: AC

## 2023-06-09 MED ORDER — PROMETHAZINE-DM 6.25-15 MG/5ML PO SYRP: 5.0000 mL | ORAL_SOLUTION | Freq: Four times a day (QID) | ORAL | 0 refills | Status: AC | PRN

## 2023-06-09 NOTE — ED Triage Notes (Signed)
Pt presents with a non-productive cough, sore throat, bodyaches, headache and nasal congestion x 2 days.

## 2023-06-09 NOTE — ED Provider Notes (Signed)
[facility]    CSN: [id number] Arrival date & time: 06/09/23  4098      History   Chief Complaint Chief Complaint  Patient presents with   Nasal Congestion   Generalized Body Aches   Cough   Sore Throat   Headache    HPI Kendra Morton is a 22 y.o. female presenting for approximately 2-day history of fatigue, body aches, dry cough, sore throat, headaches and nasal congestion.  States her boyfriend had similar symptoms but was not evaluated.  She has performed a COVID test at home and states it was negative.  She has been taking OTC meds without relief.  HPI  Past Medical History:  Diagnosis Date   Allergy    Asthma    Heart murmur    Only when she was younger   Onychia and paronychia    right toe   Sjogren's disease [facility])     Patient Active Problem List   Diagnosis Date Noted   GAD (generalized anxiety disorder) 01/30/2023   Right hip pain 12/05/2021   Anxiety 12/05/2021   White coat syndrome with high blood pressure but without hypertension 08/21/2017   Dysmenorrhea in adolescent 08/21/2017   Mild intermittent asthma without complication 06/18/2017   History of heart murmur in childhood 06/18/2017   Environmental allergies 06/18/2017    Past Surgical History:  Procedure Laterality Date   WEIL OSTEOTOMY Left 03/30/2020   Procedure: WEIL OSTEOTOMY/TAILOR'S BUNION LEFT;  Surgeon: [name], DPM;  Location: [facility] ORS;  Service: Podiatry;  Laterality: Left;   WISDOM TOOTH EXTRACTION      OB History     Gravida  0   Para  0   Term  0   Preterm  0   AB  0   Living  0      SAB  0   IAB  0   Ectopic  0   Multiple  0   Live Births  0            Home Medications    Prior to Admission medications   Medication Sig Start Date End Date Taking? Authorizing Provider  ipratropium (ATROVENT) 0.06 % nasal spray Place 2 sprays into both nostrils 4 (four) times daily. 06/09/23  Yes Shirlee Latch, PA-C  levonorgestrel  (MIRENA) 20 MCG/24HR IUD 1 each by Intrauterine route once.   Yes [provider]  promethazine-dextromethorphan (PROMETHAZINE-DM) 6.25-15 MG/5ML syrup Take 5 mLs by mouth 4 (four) times daily as needed. 06/09/23  Yes Eusebio Friendly B, PA-C  sertraline (ZOLOFT) 50 MG tablet TAKE 1.5 TABLETS BY MOUTH AT BEDTIME. 01/30/23  Yes Drubel, Lillia Abed, PA-C  albuterol (VENTOLIN HFA) 108 (90 Base) MCG/ACT inhaler Inhale 1-2 puffs into the lungs every 6 (six) hours as needed for wheezing or shortness of breath. 07/25/19   Margaretann Loveless, PA-C  hydroxychloroquine (PLAQUENIL) 200 MG tablet Take 200 mg by mouth daily.    [provider]    Family History Family History  Problem Relation Age of Onset   Autism Sister    Seizures Sister    ADD / ADHD Brother    Diabetes Maternal Grandfather    Hypertension Maternal Grandfather    Heart disease Paternal Grandfather        CHF,MI    Social History Social History   Tobacco Use   Smoking status: Never   Smokeless tobacco: Never  Vaping Use   Vaping status: Never Used  Substance Use Topics  Alcohol use: No   Drug use: No     Allergies   Sulfamethoxazole-trimethoprim, Cephalexin, and Penicillins   Review of Systems Review of Systems  Constitutional:  Positive for fatigue. Negative for chills, diaphoresis and fever.  HENT:  Positive for congestion, postnasal drip and sore throat. Negative for ear pain, rhinorrhea, sinus pressure and sinus pain.   Respiratory:  Positive for cough. Negative for shortness of breath.   Cardiovascular:  Negative for chest pain.  Gastrointestinal:  Negative for abdominal pain, nausea and vomiting.  Musculoskeletal:  Positive for myalgias.  Skin:  Negative for rash.  Neurological:  Positive for headaches. Negative for weakness.  Hematological:  Negative for adenopathy.     Physical Exam Triage Vital Signs ED Triage Vitals  Encounter Vitals Group     BP      Systolic BP Percentile       Diastolic BP Percentile      Pulse      Resp      Temp      Temp src      SpO2      Weight      Height      Head Circumference      Peak Flow      Pain Score      Pain Loc      Pain Education      Exclude from Growth Chart    No data found.  Updated Vital Signs BP 120/74 (BP Location: Left Arm)   Pulse 86   Temp 98.9 F (37.2 C) (Oral)   Resp 18   SpO2 100%    Physical Exam Vitals and nursing note reviewed.  Constitutional:      General: She is not in acute distress.    Appearance: Normal appearance. She is not ill-appearing or toxic-appearing.  HENT:     Head: Normocephalic and atraumatic.     Nose: Congestion present.     Mouth/Throat:     Mouth: Mucous membranes are moist.     Pharynx: Oropharynx is clear. Posterior oropharyngeal erythema present.  Eyes:     General: No scleral icterus.       Right eye: No discharge.        Left eye: No discharge.     Conjunctiva/sclera: Conjunctivae normal.  Cardiovascular:     Rate and Rhythm: Normal rate and regular rhythm.     Heart sounds: Normal heart sounds.  Pulmonary:     Effort: Pulmonary effort is normal. No respiratory distress.     Breath sounds: Normal breath sounds.  Musculoskeletal:     Cervical back: Neck supple.  Skin:    General: Skin is dry.  Neurological:     General: No focal deficit present.     Mental Status: She is alert. Mental status is at baseline.     Motor: No weakness.     Gait: Gait normal.  Psychiatric:        Mood and Affect: Mood normal.        Behavior: Behavior normal.        Thought Content: Thought content normal.      UC Treatments / Results  Labs (all labs ordered are listed, but only abnormal results are displayed) Labs Reviewed  GROUP A STREP BY PCR  RAPID INFLUENZA A&B ANTIGENS  SARS CORONAVIRUS 2 BY RT PCR    EKG   Radiology No results found.  Procedures Procedures (including critical care time)  Medications Ordered in UC Medications -  No data to  display  Initial Impression / Assessment and Plan / UC Course  I have reviewed the triage vital signs and the nursing notes.  Pertinent labs & imaging results that were available during my care of the patient were reviewed by me and considered in my medical decision making (see chart for details).   22 year old female presents for fatigue, body aches, headaches, cough, congestion and sore throat x 2 days.  Negative COVID test at home but she desires to be retested.  Vitals are normal and stable she is overall well-appearing.  No acute distress.  On exam has nasal congestion and erythema posterior pharynx.  Chest clear.  COVID and strep testing obtained as well as flu testing.  All testing negative.  Reviewed results with patient.  Viral URI.  Supportive care encouraged.  Sent Promethazine DM and Atrovent nasal spray to pharmacy.  Reviewed return and ED precautions.   Final Clinical Impressions(s) / UC Diagnoses   Final diagnoses:  Viral upper respiratory tract infection  Acute cough  Sore throat     Discharge Instructions      -Negative strep, COVID and flu testing.   URI/COLD SYMPTOMS: Your exam today is consistent with a viral illness. Antibiotics are not indicated at this time. Use medications as directed, including cough syrup, nasal saline, and decongestants. Your symptoms should improve over the next few days and resolve within 7-10 days. Increase rest and fluids. F/u if symptoms worsen or predominate such as sore throat, ear pain, productive cough, shortness of breath, or if you develop high fevers or worsening fatigue over the next several days.       ED Prescriptions     Medication Sig Dispense Auth. Provider   promethazine-dextromethorphan (PROMETHAZINE-DM) 6.25-15 MG/5ML syrup Take 5 mLs by mouth 4 (four) times daily as needed. 118 mL Eusebio Friendly B, PA-C   ipratropium (ATROVENT) 0.06 % nasal spray Place 2 sprays into both nostrils 4 (four) times daily. 15 mL Shirlee Latch, PA-C      PDMP not reviewed this encounter.   Shirlee Latch, PA-C 06/09/23 1135

## 2023-06-09 NOTE — Discharge Instructions (Addendum)
-  Negative strep, COVID and flu testing.   URI/COLD SYMPTOMS: Your exam today is consistent with a viral illness. Antibiotics are not indicated at this time. Use medications as directed, including cough syrup, nasal saline, and decongestants. Your symptoms should improve over the next few days and resolve within 7-10 days. Increase rest and fluids. F/u if symptoms worsen or predominate such as sore throat, ear pain, productive cough, shortness of breath, or if you develop high fevers or worsening fatigue over the next several days.

## 2023-09-28 ENCOUNTER — Encounter: Payer: Self-pay | Admitting: Dietician

## 2023-09-28 ENCOUNTER — Encounter: Payer: 59 | Attending: Family Medicine | Admitting: Dietician

## 2023-09-28 VITALS — Ht 65.0 in | Wt 139.2 lb

## 2023-09-28 DIAGNOSIS — Z713 Dietary counseling and surveillance: Secondary | ICD-10-CM | POA: Insufficient documentation

## 2023-09-28 DIAGNOSIS — R634 Abnormal weight loss: Secondary | ICD-10-CM

## 2023-09-28 DIAGNOSIS — K9049 Malabsorption due to intolerance, not elsewhere classified: Secondary | ICD-10-CM | POA: Insufficient documentation

## 2023-09-28 DIAGNOSIS — Z6823 Body mass index (BMI) 23.0-23.9, adult: Secondary | ICD-10-CM | POA: Diagnosis not present

## 2023-09-28 NOTE — Patient Instructions (Addendum)
Start an exercise regiment of lifting on Monday, Wednesday, and Friday.  Go to the gym and stretch on Tuesdays and Thursday.   Monday - Back and biceps Wednesday - Legs Friday - Chest, shoulders, triceps  Consider going out one day on the weekend with friends/family in an environment where you are around new foods and may be interested in trying some of them. Move at your comfort level!  Incorporate more healthy fats in your diet things like avocado, hummus (dark chocolate), etc. And medium fat dairy (yogurt, cheese, chocolate milk)  Consider trying a higher calorie protein supplement like an Ensure MAX, or Boost PLUS, for some extra calories with your protein shake.

## 2023-09-28 NOTE — Progress Notes (Signed)
Medical Nutrition Therapy  Appointment Start time:  1030  Appointment End time:  1130  Primary concerns today: Unintended weight loss  Referral diagnosis: K90.49 - Food intolerance in adult Preferred learning style: No preference indicated Learning readiness: Ready   NUTRITION ASSESSMENT   Anthropometrics: Ht: 65" Wt: 139.2 lbs BMI: 23.16 kg/m2 Wt Hx: -11-14 lbs. Over last ~6 months Goal Weight: 145-150 lbs.   Clinical Medical Hx: Sjorgen's syndrome, GAD, Weight loss Medications: Hydroxychloroquine, Sertraline Labs: Reviewed Notable Signs/Symptoms: Weight loss   Lifestyle & Dietary Hx Pt reports unintended weight loss of 11-14 lbs  since getting Sjogren's diagnosis the summer of 2024. Pt reports also feeling low energy during this period of time.  Pt reports low baseline appetite, will eat but doesn't find themselves hungry often. Pt reports integrating snacks between meals since weight loss started, incorporating lean proteins at each meal. Pt reports eating a very similar diet every day, eats every 2-3 hours, averse to trying new foods very often.  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 Pt states they have to stay busy, finds it difficult to relax. Pt reports liking to read and scroll TikTok in downtime. Pt reports physical activity regiment that includes resistance training M-F for 60 minutes, and Yoga 3x a week. Pt states their exercise helps with stress relief. Pt reports playing soccer when younger, has always been active/athletic.   Estimated daily fluid intake: 192 oz Supplements: N/A Sleep: Difficulty falling asleep, but sleeps well for 7-8 hours Stress / self-care: Varies, school is biggest stressor Current average weekly physical activity: Yoga (3x a week), Weight lifting 5x a week (60 minutes)   24-Hr Dietary Recall First Meal: [place] sausage, egg and cheese on  rye Snack: Second Meal: Smoothie (mixed berries, Premier protein) Snack:  Third Meal: Baked chicken, green beans, rice Snack:  Beverages: Coffee, water   NUTRITION DIAGNOSIS  [place]-3.2 Unintentional weight loss As related to excessive exercise.  As evidenced by resistance training 5x a week for 60 minutes, weight loss 7% x6 months, inability to meet energy needs for weight maintenance through diet.Marland Kitchen   NUTRITION INTERVENTION  Nutrition education (E-1) on the following topics:  Educated patient on the two components of energy balance: Energy in (calories), and energy out (activity). Explain the role of negative energy balance in weight loss. Discussed options with patient to achieve a negative energy balance and how to best control energy in and energy out to accommodate their lifestyle. Educated patient on the role of increasing calories and protein on building muscle mass and healthy weight gain. Worked with patient to identify sources of protein and healthy fats, as well as how to increase calories when preparing food. Educated patient to eat 5-6 times a day to maximize protein intake. Encourage patient to incorporate physical activity, especially resistance training, as much as possible.  Handouts Provided Include  AVS  Learning Style & Readiness for Change Teaching method utilized: Visual & Auditory  Demonstrated degree of understanding via: Teach Back  Barriers to learning/adherence to lifestyle change: "Busy body"   Goals Established by Pt Start an exercise regiment of lifting on Monday, Wednesday, and Friday.  Go to the gym and stretch on Tuesdays and Thursday.   Monday - Back and biceps Wednesday - Legs Friday - Chest, shoulders, triceps Consider going out one day on the weekend with friends/family in an environment where you are around new foods and may be interested  in trying some of them. Move at your comfort level! Incorporate more healthy fats in your diet things like  avocado, hummus (dark chocolate), etc. And medium fat dairy (yogurt, cheese, chocolate milk) Consider trying a higher calorie protein supplement like an Ensure MAX, or Boost PLUS, for some extra calories with your protein shake.   MONITORING & EVALUATION Dietary intake, weekly physical activity, and weight change in 6 weeks.  Next Steps  Patient is to follow up with RD.

## 2023-11-16 ENCOUNTER — Ambulatory Visit: Payer: 59 | Admitting: Dietician

## 2023-11-17 ENCOUNTER — Ambulatory Visit: Payer: 59 | Admitting: Dietician

## 2023-11-26 ENCOUNTER — Encounter: Attending: Family Medicine | Admitting: Dietician

## 2023-11-26 VITALS — Ht 65.0 in | Wt 133.5 lb

## 2023-11-26 DIAGNOSIS — R634 Abnormal weight loss: Secondary | ICD-10-CM | POA: Insufficient documentation

## 2023-11-26 NOTE — Patient Instructions (Addendum)
 Try some higher calorie yogurt options like whole fat greek ([place], [place], [place]) or Facilities manager ([employer], or [place] [employer]) to add your cereal and fruit.  Try having PB and Jelly sandwiches as a mid-morning snack!   Make some avocado toast with your homemade sourdough!  Try Lynnae Prude brand protein foods (granola, pancakes, waffles) for a high protein breakfast option  Take an inventory of all the foods that you feel like you eat regularly, then think about new foods that you would really like to try. Google recipes that contain one item you eat regularly and an item you would like to try, choose a few different recipes that come up and try them!

## 2023-11-26 NOTE — Progress Notes (Unsigned)
 Medical Nutrition Therapy  Appointment Start time:  1450  Appointment End time:  1530  Primary concerns today: Unintended weight loss  Referral diagnosis: K90.49 - Food intolerance in adult Preferred learning style: No preference indicated Learning readiness: Ready   NUTRITION ASSESSMENT   Anthropometrics: Ht: 65" Wt: 133.5 lbs BMI: 22.22 kg/m2 Wt Hx: -6 lbs x 6 weeks Goal Weight: 145-150 lbs.   Clinical Medical Hx: Sjorgen's syndrome, GAD, Weight loss Medications: Hydroxychloroquine, Sertraline Labs: Reviewed Notable Signs/Symptoms: Weight loss   Lifestyle & Dietary Hx Pt reports unintended weight loss of 11-14 lbs  since getting Sjogren's diagnosis the summer of 2024. Pt reports also feeling low energy during this period of time.  Pt reports low baseline appetite, will eat but doesn't find themselves hungry often. Pt reports integrating snacks between meals since weight loss started, incorporating lean proteins at each meal. Pt reports eating a very similar diet every day, eats every 2-3 hours, averse to trying new foods very often.  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 Pt states they have to stay busy, finds it difficult to relax. Pt reports liking to read and scroll TikTok in downtime. Pt reports physical activity regiment that includes resistance training M-F for 60 minutes, and Yoga 3x a week. Pt states their exercise helps with stress relief. Pt reports playing soccer when younger, has always been active/athletic.    Pt reports difficulty exercising less initially, but has found things to do that are helpful to occupy downtime, taking more naps, active recovery, walks with dog.  Pt reports feeling more rested now, reports feeling stronger at the gym.  Pt reports they have been trying new foods (chocolate hummus, new vegetables), switched protein shakes from Fairlife to Boost.  Pt  reports desire to branch out with their food choices.  Pt reports no changes in stress or anxiety.   Estimated daily fluid intake: 192 oz Supplements: N/A Sleep: Difficulty falling asleep, but sleeps well for 7-8 hours Stress / self-care: Varies, school is biggest stressor Current average weekly physical activity: Yoga (3x a week), Weight lifting 5x a week (60 minutes)   24-Hr Dietary Recall First Meal: Oikos Triple ZERO, cereal, fruit Snack: 1 Rice Krispy treats Second Meal: [place] sandwich w/ provolone, sundried tomatoes, pesto on ciabatta bread, grapes, water Snack: Protein shake (Boost) Third Meal: Grilled chicken, smashed potatoes, broccoli Snack: Chocolate covered strawberries Beverages: Coffee, water   NUTRITION DIAGNOSIS  [place]-3.2 Unintentional weight loss As related to excessive exercise.  As evidenced by resistance training 5x a week for 60 minutes, weight loss 7% x6 months, inability to meet energy needs for weight maintenance through diet.Marland Kitchen   NUTRITION INTERVENTION  Nutrition education (E-1) on the following topics:  Educated patient on the two components of energy balance: Energy in (calories), and energy out (activity). Explain the role of negative energy balance in weight loss. Discussed options with patient to achieve a negative energy balance and how to best control energy in and energy out to accommodate their lifestyle. Educated patient on the role of increasing calories and protein on building muscle mass and healthy weight gain. Worked with patient to identify sources of protein and healthy fats, as well as how to increase calories when preparing food. Educated patient to eat 5-6 times a day to maximize protein intake. Encourage patient to incorporate physical activity, especially resistance training, as much as possible.  Handouts Provided Include  AVS  Learning  Style & Readiness for Change Teaching method utilized: Visual & Auditory  Demonstrated degree of  understanding via: Teach Back  Barriers to learning/adherence to lifestyle change: "Busy body"   Goals Established by Pt Start an exercise regiment of lifting on Monday, Wednesday, and Friday.  Go to the gym and stretch on Tuesdays and Thursday.   Monday - Back and biceps Wednesday - Legs Friday - Chest, shoulders, triceps Consider going out one day on the weekend with friends/family in an environment where you are around new foods and may be interested in trying some of them. Move at your comfort level! Incorporate more healthy fats in your diet things like avocado, hummus (dark chocolate), etc. And medium fat dairy (yogurt, cheese, chocolate milk) Consider trying a higher calorie protein supplement like an Ensure MAX, or Boost PLUS, for some extra calories with your protein shake.   MONITORING & EVALUATION Dietary intake, weekly physical activity, and weight change in 6 weeks.  Next Steps  Patient is to follow up with RD.

## 2023-11-27 ENCOUNTER — Encounter: Payer: Self-pay | Admitting: Dietician

## 2024-01-21 ENCOUNTER — Encounter: Payer: Self-pay | Admitting: Dietician

## 2024-01-21 ENCOUNTER — Encounter: Attending: Family Medicine | Admitting: Dietician

## 2024-01-21 VITALS — Ht 65.0 in | Wt 134.7 lb

## 2024-01-21 DIAGNOSIS — R634 Abnormal weight loss: Secondary | ICD-10-CM | POA: Insufficient documentation

## 2024-01-21 NOTE — Patient Instructions (Addendum)
 Try adding some peanut butter, syrup, and cinnamon to your pancakes on Sunday.  Look into different restaurants in the area for new food options. Try [employer] for a wide variety of [place] foods!  Keep up the great work!! Counselling psychologist on your increases in strength in the gym and muscle gains.

## 2024-01-21 NOTE — Progress Notes (Signed)
 Medical Nutrition Therapy  Appointment Start time:  1400  Appointment End time:  1445  Primary concerns today: Unintended weight loss  Referral diagnosis: K90.49 - Food intolerance in adult Preferred learning style: No preference indicated Learning readiness: Ready   NUTRITION ASSESSMENT   Anthropometrics: Ht: 65" Wt: 134.7 lbs BMI: 22.22 kg/m2 Wt Hx: +1.5 lbs x 7 weeks Goal Weight: 145-150 lbs.  Body Composition Scale Date  Current Body Weight 134.2 lbs  Total Body Fat % 20.0   Muscle-Mass lbs 58.9  BMI 22.3  Lean Mass Displacement          Torso  lbs 45.4         Left Leg  lbs 15.52         Right Leg  lbs 15.59         Left Arm  lbs 5.45         Right Arm   lbs 5.20    Clinical Medical Hx: Sjorgen's syndrome, GAD, Weight loss Medications: Hydroxychloroquine, Sertraline  Labs: Reviewed Notable Signs/Symptoms: Weight loss   Lifestyle & Dietary Hx Pt reports graduating college last week, currently working 35-39 hours at [employer] weekly now. Pt reports feeling more energetic throughout the day, states they are very comfortable where they are nutritionally. Pt reports eating PB&J sandwiches between breakfast and lunch meals, adding Kodiak granola to yogurt in the morning. Pt reports switching to high protein (20g) yogurt. Pt states choosing higher calorie options of foods they previously ate has been helpful and they find it to be sustainable. Pt reports still struggling with trying new foods, has used ChatGPT to suggest recipes with some success. Pt reports continuing to exercise 3x a week for ~60 minutes total (45 resistance training/20 mins cardio), states they continue to see improvements in strength and endurance.   Estimated daily fluid intake: 192 oz Supplements: N/A Sleep: Difficulty falling asleep, but sleeps well for 7-8 hours Stress / self-care: Improved Current average weekly physical activity: Yoga (3x a week), Weight lifting 5x a week (60  minutes)   24-Hr Dietary Recall First Meal: Chobani 20g protein, Kodiak granola Snack: PB&J sandwich Second Meal: Spinach feta wrap w/ rotisserie chicken, Cheez Its Snack: Core Power 42g protein shake Third Meal: 6 Chicken nuggets, mac and cheese Snack: Reese's animal crackers Beverages: Iced coffee, water   NUTRITION DIAGNOSIS  [place]-3.2 Unintentional weight loss As related to excessive exercise.  As evidenced by resistance training 5x a week for 60 minutes, weight loss 7% x6 months, inability to meet energy needs for weight maintenance through diet.Aaron Aas   NUTRITION INTERVENTION  Nutrition education (E-1) on the following topics:  Educated patient on the two components of energy balance: Energy in (calories), and energy out (activity). Explain the role of negative energy balance in weight loss. Discussed options with patient to achieve a negative energy balance and how to best control energy in and energy out to accommodate their lifestyle. Educated patient on the role of increasing calories and protein on building muscle mass and healthy weight gain. Worked with patient to identify sources of protein and healthy fats, as well as how to increase calories when preparing food. Educated patient to eat 5-6 times a day to maximize protein intake. Encourage patient to incorporate physical activity, especially resistance training, 3-5 times weekly  Handouts Provided Include  Body Composition Scan results  Learning Style & Readiness for Change Teaching method utilized: Visual & Auditory  Demonstrated degree of understanding via: Teach Back  Barriers to learning/adherence to lifestyle  change: None identified    Goals Established by Pt Try adding some peanut butter, syrup, and cinnamon to your pancakes on Sunday. Look into different restaurants in the area for new food options. Try [employer] for a wide variety of [place] foods! Keep up the great work!! Counselling psychologist on your increases in  strength in the gym and muscle gains.   MONITORING & EVALUATION Dietary intake, weekly physical activity, and body composition change in 6 weeks.  Next Steps  Patient is to follow up with RD.

## 2024-01-30 ENCOUNTER — Other Ambulatory Visit: Payer: Self-pay | Admitting: Physician Assistant

## 2024-01-30 DIAGNOSIS — F411 Generalized anxiety disorder: Secondary | ICD-10-CM

## 2024-03-22 ENCOUNTER — Encounter: Attending: Family Medicine | Admitting: Dietician

## 2024-03-22 ENCOUNTER — Encounter: Payer: Self-pay | Admitting: Dietician

## 2024-03-22 VITALS — Ht 65.0 in | Wt 132.8 lb

## 2024-03-22 DIAGNOSIS — R634 Abnormal weight loss: Secondary | ICD-10-CM | POA: Diagnosis present

## 2024-03-22 NOTE — Patient Instructions (Addendum)
 Continue trying new foods! If you see something that interests you, make a note in your phone to revisit and plan to go eat there. Try [place] food next (Papaya salad)!  Increase your healthy fats with plant-based oils and butter spreads, avocado, nuts (walnuts/pecans), ground chia or flax seeds.  Please reach out if you have any questions!! Keep up the great work!

## 2024-03-22 NOTE — Progress Notes (Signed)
 Medical Nutrition Therapy  Appointment Start time:  1400  Appointment End time:  1445  Primary concerns today: Unintended weight loss  Referral diagnosis: K90.49 - Food intolerance in adult Preferred learning style: No preference indicated Learning readiness: Ready   NUTRITION ASSESSMENT   Anthropometrics: Ht: 65 Wt: 132.3 lbs BMI: 22.22 kg/m2 Wt Hx: -2 lbs x 8 weeks Goal Weight: 145-150 lbs.  Body Composition Scale Date 01/21/2024  03/22/2024  Current Body Weight 134.2 lbs 132.3  Total Body Fat % 20.0  22.8  Muscle-Mass lbs 58.9 55.8  BMI 22.3 22.7  Lean Mass Displacement           Torso  lbs 45.4 42.8         Left Leg  lbs 15.52 15.23         Right Leg  lbs 15.59 15.12         Left Arm  lbs 5.45 5.00         Right Arm   lbs 5.20 4.81    Clinical Medical Hx: Sjorgen's syndrome, GAD, Weight loss Medications: Hydroxychloroquine, Sertraline  Labs: Reviewed Notable Signs/Symptoms: Weight loss   Lifestyle & Dietary Hx Pt states they have been more active over the summer, reports playing more tennis recently, states they have been on a few vacations and haven't stuck to their diet as much over the last 3 weeks. Pt reports trying new foods like pineapples and [place] food (Chicken Tikka Masala). Pt reports continuing to work ~35 hours weekly. Pt reports seeing progress in their lifts, continuing to be maintain high energy levels. 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     Estimated daily fluid intake: 192 oz Supplements: N/A Sleep: Difficulty falling asleep, but sleeps well for 7-8 hours Stress / self-care: Improved Current average weekly physical activity: Yoga (3x a week), Weight lifting 3x a week (60 minutes)   24-Hr Dietary Recall First Meal: Oikos 20g protein yogurt, Kodiak granola, blueberries, strawberries Snack: 42g CorePower Fairlife Protein shake Second Meal:  Snack:  Uncrustable Third Meal: Pulled pork, mac and cheese, green beans Snack: Peaches, brown sugar, cinnamon Beverages: Water, Liquid IV, iced coffee   NUTRITION DIAGNOSIS  [place]-3.2 Unintentional weight loss As related to excessive exercise.  As evidenced by resistance training 5x a week for 60 minutes, weight loss 7% x6 months, inability to meet energy needs for weight maintenance through diet.SABRA   NUTRITION INTERVENTION  Nutrition education (E-1) on the following topics:  Educated patient on the two components of energy balance: Energy in (calories), and energy out (activity). Explain the role of negative energy balance in weight loss. Discussed options with patient to achieve a negative energy balance and how to best control energy in and energy out to accommodate their lifestyle. Educated patient on the role of increasing calories and protein on building muscle mass and healthy weight gain. Worked with patient to identify sources of protein and healthy fats, as well as how to increase calories when preparing food. Educated patient to eat 5-6 times a day to maximize protein intake. Encourage patient to incorporate physical activity, especially resistance training, 3-5 times weekly   Handouts Provided Include  Body Composition Scan results  Learning Style & Readiness for Change Teaching method utilized: Visual & Auditory  Demonstrated degree of understanding via: Teach Back  Barriers to learning/adherence to lifestyle change: None identified    Goals Established by Pt Continue trying new foods! If  you see something that interests you, make a note in your phone to revisit and plan to go eat there. Try [place] food next (Papaya salad)! Increase your healthy fats with plant-based oils and butter spreads, avocado, nuts (walnuts/pecans), ground chia or flax seeds. Please reach out if you have any questions!! Keep up the great work!   MONITORING & EVALUATION Dietary intake, weekly physical activity,  and body composition change PRN  Next Steps  Patient is to follow up with RD PRN.
